# Patient Record
Sex: Female | Born: 1975 | Race: White | Hispanic: No | Smoking: Never smoker
Health system: Southern US, Community
[De-identification: ages and names within clinical notes are randomized; demographics above are authoritative.]

## PROBLEM LIST (undated history)

## (undated) DIAGNOSIS — K219 Gastro-esophageal reflux disease without esophagitis: Secondary | ICD-10-CM

## (undated) DIAGNOSIS — Z8489 Family history of other specified conditions: Secondary | ICD-10-CM

## (undated) DIAGNOSIS — G459 Transient cerebral ischemic attack, unspecified: Secondary | ICD-10-CM

## (undated) DIAGNOSIS — R51 Headache: Secondary | ICD-10-CM

## (undated) DIAGNOSIS — R519 Headache, unspecified: Secondary | ICD-10-CM

## (undated) DIAGNOSIS — D649 Anemia, unspecified: Secondary | ICD-10-CM

## (undated) HISTORY — PX: ROTATOR CUFF REPAIR: SHX139

## (undated) HISTORY — PX: COLONOSCOPY WITH ESOPHAGOGASTRODUODENOSCOPY (EGD): SHX5779

## (undated) HISTORY — DX: Transient cerebral ischemic attack, unspecified: G45.9

---

## 1999-02-08 ENCOUNTER — Encounter: Payer: Self-pay | Admitting: Neurosurgery

## 1999-02-08 ENCOUNTER — Ambulatory Visit (HOSPITAL_COMMUNITY): Admission: RE | Admit: 1999-02-08 | Discharge: 1999-02-08 | Payer: Self-pay | Admitting: Neurosurgery

## 1999-12-14 ENCOUNTER — Encounter: Admission: RE | Admit: 1999-12-14 | Discharge: 2000-03-13 | Payer: Self-pay | Admitting: Dentistry

## 2005-12-11 ENCOUNTER — Ambulatory Visit: Payer: Self-pay | Admitting: Family Medicine

## 2008-02-17 ENCOUNTER — Ambulatory Visit: Payer: Self-pay | Admitting: Family Medicine

## 2008-03-14 ENCOUNTER — Ambulatory Visit: Payer: Self-pay | Admitting: Obstetrics and Gynecology

## 2008-03-18 ENCOUNTER — Ambulatory Visit: Payer: Self-pay | Admitting: Obstetrics and Gynecology

## 2009-06-14 ENCOUNTER — Encounter
Admission: RE | Admit: 2009-06-14 | Discharge: 2009-06-14 | Payer: Self-pay | Source: Home / Self Care | Admitting: Obstetrics and Gynecology

## 2009-07-25 ENCOUNTER — Inpatient Hospital Stay (HOSPITAL_COMMUNITY): Admission: AD | Admit: 2009-07-25 | Discharge: 2009-07-26 | Payer: Self-pay | Admitting: Obstetrics & Gynecology

## 2010-06-26 LAB — CBC
HCT: 30.5 % — ABNORMAL LOW (ref 36.0–46.0)
Hemoglobin: 10.1 g/dL — ABNORMAL LOW (ref 12.0–15.0)
Hemoglobin: 9.1 g/dL — ABNORMAL LOW (ref 12.0–15.0)
MCHC: 33 g/dL (ref 30.0–36.0)
MCHC: 33.9 g/dL (ref 30.0–36.0)
MCV: 76.6 fL — ABNORMAL LOW (ref 78.0–100.0)
Platelets: 295 10*3/uL (ref 150–400)
RBC: 3.55 MIL/uL — ABNORMAL LOW (ref 3.87–5.11)
RBC: 3.98 MIL/uL (ref 3.87–5.11)
RDW: 15.1 % (ref 11.5–15.5)
RDW: 15.2 % (ref 11.5–15.5)
WBC: 11.4 10*3/uL — ABNORMAL HIGH (ref 4.0–10.5)

## 2010-06-26 LAB — RH IMMUNE GLOB WKUP(>/=20WKS)(NOT WOMEN'S HOSP): Fetal Screen: NEGATIVE

## 2010-06-26 LAB — GLUCOSE, CAPILLARY: Glucose-Capillary: 74 mg/dL (ref 70–99)

## 2010-06-26 LAB — RPR: RPR Ser Ql: NONREACTIVE

## 2010-06-26 LAB — GLUCOSE, RANDOM: Glucose, Bld: 87 mg/dL (ref 70–99)

## 2010-08-24 NOTE — Procedures (Signed)
[facility]  Patient:    Stacey Underwood, Stacey Underwood                      MRN: [id number] Proc. Date: 12/17/99 Adm. Date:  [id number] Attending:  Thyra Breed                           Procedure Report  PROCEDURE:  Lumbar facet joint nerve blocks at L3-4, 4-5 and 5-S1.  DIAGNOSIS:  Back pain with history of work related injury.  HISTORY OF PRESENT ILLNESS:  Stacey Underwood is a 35 year old who is sent to [place] by Dr. Murray Hodgkins for a trial of lumbar sympathetic blocks on the right side.  The patient gives a history of a work related injury which occurred in January of 2000. Initially she was treated at the [facility] in [place] and eventually seen by Dr. Aliene Beams who obtained an MRI and x-rays of her back which demonstrated some mild facet joint arthropathy at L5-S1. She was sent to Dr. Murray Hodgkins after she failed physical therapy and underwent more intense physical therapy followed by a trial of a TENS unit which does result in partial improvement and chiropractic treatments which would temporarily help. She stated that physical therapy increased her discomfort. She was tried on steroid dosepak and nonsteroidals with no overall improvement.  She describes her pain as a sharp pain on the right side of her lower back at the lumbosacral region which is associated with global numbness and tingling of her right lower extremity. She had some give away weakness of her right lower extremity. She denied any bowel or bladder incontinence. She states that her pain is made worse by prolonged stationary position such as sitting or standing and improved by the TENS unit.  CURRENT MEDICATIONS:  Ibuprofen, effexor and birth control pills.  ALLERGIES:  No known drug allergies.  FAMILY HISTORY:  Positive for hypertension, diabetes and coronary artery disease.  PAST SURGICAL HISTORY:  Negative.  SOCIAL HISTORY:  The patient works as an Engineer, materials dyes. She does  not smoke nor drink alcohol.  ACTIVE MEDICAL PROBLEMS:  Depression, anxiety and probable underlying allergic rhinitis.  REVIEW OF SYSTEMS:  GENERAL:  Significant for some recent nasal stuffiness and associated malaise. HEAD:  She has retro-orbital headaches. EYES:  Negative. NOSE/MOUTH/THROAT:  Significant for runny nose recently. PULMONARY:  Negative. CARDIOVASCULAR:  Negative. GI: Negative. GU:  Negative.  MUSCULOSKELETAL:  See HPI for pertinent positives. NEUROLOGIC:  See HPI for pertinent positives and a history of seizure or stroke-like symptoms. HEMATOLOGIC:  Negative. CUTANEOUS:  Negative.  ENDOCRINE:  Negative. ALLERGY/IMMUNOLOGIC:  Significant for recent nasal stuffiness. PSYCHIATRIC:  Significant for depression.  PHYSICAL EXAMINATION:  VITAL SIGNS:  Blood pressure 119/69, heart rate 72, respiratory rate 10, O2 saturations 99%, pain level 7-8/10 and temperature is 98.  GENERAL:  This is a pleasant female in no acute distress.  HEENT:  Head was normocephalic, atraumatic. Eyes, extraocular movements intact with conjunctivae and sclerae clear. Nose patent nares without discharge. Oropharynx was free of lesions. Good dentition.  NECK:  Supple without lymphadenopathy. Carotids are 2+ and symmetric without bruits.  LUNGS:  Clear.  HEART:  Regular rate and rhythm without murmur, gallop or rub.  BREASTS/ABDOMINAL/PELVIC/RECTAL:  Not performed.  EXTREMITIES:  Demonstrate no cyanosis, clubbing or edema with radial pulses and dorsalis pedis pulse 2+ and symmetric.  NEUROLOGIC:  The patient was oriented x 4. Cranial nerves II-XII are grossly intact. Deep  tendon reflexes were symmetric in the upper and lower extremities with down going toes. Motor was 5/5 with symmetric bulk and tone. Sensation was intact to vibratory sense and pinprick as well as light touch. Coordination was grossly intact.  BACK:  Revealed increased pain on hyperextension to about 30 degrees with reduced  pain on forward flexion. Most of her pain was localized to the right lower lumbosacral region. The area was marked.  IMPRESSION: 1. Low back pain which the patient relates from a work related injury in    January of 2000 with MRI demonstrating some mild bilateral L5-S1 facet    joint arthropathy. 2. Depression.  DISPOSITION:  I discussed the potential risks, benefits and limitations of a facet nerve block in detail with the patient. The patient is interested in proceeding.  DESCRIPTION OF PROCEDURE:  After informed consent was obtained, the patient was taken to the fluoroscopy suite where she was placed in the prone position with a pillow under her abdomen and monitored. Her back was examined using fluoroscopic guidance and the lumbar vertebrae L1 through S1 were marked over their spinous processes. The junction of the superior articulating process where the transverse process was marked at L3, 4-5 and the sacral cornu was identified and marked on the right side. The patient had pin pointed her pain predominantly over the region of the L4-5 facet joint on exam when the skin was marked. The skin was prepped with Betadine x 3 and draped. Using a 25 gauge needle, I anesthetized the skin and subcutaneous structures at each of the levels with 2 cc of 1% lidocaine. A 25 gauge spinal needle was introduced down to the junction of the superior articulating process and the transverse process at L3-4 and 4-5 and down to the sacral cornu at L5-S1. Aspiration was negative for blood and CSF. Oblique projections confirmed placement as well as lateral projections. The needles were injected with a 0.5 cc of 1% lidocaine at each level. Thirty seconds was allowed to pass and there was no evidence of numbness or tingling out into the lower extremity on the right side nor weakness. 1 cc of 1% lidocaine with the approximately 12 mg of Medrol was injected at each level and the needles were flushed with a 0.5  cc of 1% lidocaine and removed intact. Her back was cleansed free of Betadine.  Fifteen minutes after the procedure, the patient noted no reduction in her  back discomfort. This suggested that facet joints are not the source of her pain. Her vital signs were stable.  DISPOSITION: 1. Resume previous diet. 2. Limitations in activities per instruction sheet. 3. Continue on current medications. 4. The patient plans to follow-up with Dr. Murray Hodgkins next week. DD:  12/17/99 TD:  12/17/99 Job: [id number]

## 2013-11-19 ENCOUNTER — Emergency Department: Payer: Self-pay | Admitting: Emergency Medicine

## 2013-11-19 LAB — URINALYSIS, COMPLETE
Bilirubin,UR: NEGATIVE
Glucose,UR: NEGATIVE mg/dL (ref 0–75)
Ketone: NEGATIVE
Nitrite: NEGATIVE
Ph: 6 (ref 4.5–8.0)
Protein: 500
Specific Gravity: 1.03 (ref 1.003–1.030)

## 2014-04-08 HISTORY — PX: CARPAL TUNNEL RELEASE: SHX101

## 2014-04-22 ENCOUNTER — Ambulatory Visit: Payer: Self-pay | Admitting: Specialist

## 2014-04-22 LAB — BASIC METABOLIC PANEL
Anion Gap: 5 — ABNORMAL LOW (ref 7–16)
BUN: 12 mg/dL (ref 7–18)
CALCIUM: 8.7 mg/dL (ref 8.5–10.1)
CHLORIDE: 105 mmol/L (ref 98–107)
Co2: 28 mmol/L (ref 21–32)
Creatinine: 0.7 mg/dL (ref 0.60–1.30)
EGFR (African American): >60
EGFR (Non-African Amer.): >60
Glucose: 106 mg/dL — ABNORMAL HIGH (ref 65–99)
OSMOLALITY: 276 (ref 275–301)
POTASSIUM: 3.8 mmol/L (ref 3.5–5.1)
SODIUM: 138 mmol/L (ref 136–145)

## 2014-06-15 ENCOUNTER — Ambulatory Visit: Payer: Self-pay | Admitting: Otolaryngology

## 2014-07-26 ENCOUNTER — Encounter
Admit: 2014-07-26 | Disposition: A | Discharge status: Home / Self Care | Payer: Self-pay | Attending: Otolaryngology | Admitting: Otolaryngology

## 2014-08-07 NOTE — Op Note (Signed)
PATIENT NAME:  Stacey Underwood, Stacey Underwood MR#:  [id number] DATE OF BIRTH:  1975-06-28  DATE OF PROCEDURE:  04/22/2014  PREOPERATIVE DIAGNOSIS: Bilateral carpal tunnel syndrome.   POSTOPERATIVE DIAGNOSIS: Bilateral carpal tunnel syndrome.   PROCEDURES: 1. Right carpal tunnel release.  2. Left carpal tunnel release.   SURGEON: Lucas Mallow, MD    ANESTHESIA: General.   COMPLICATIONS: None.   TOURNIQUET TIME: 18 minutes on the right and 14 minutes on the left.   DESCRIPTION OF PROCEDURE: After adequate induction of general anesthesia, both upper extremities were thoroughly prepped with alcohol and ChloraPrep and draped in standard sterile fashion. Identical procedures were performed on each side. The extremity is wrapped out with the Esmarch bandage and pneumatic tourniquet elevated to 250 mmHg. Under loupe magnification, standard volar carpal tunnel incision is made and the dissection carefully carried down to the transverse retinacular ligament. This is carefully incised in the midportion. The distal release is performed with the small scissors. The proximal release is performed under direct vision using the small scissors and the carpal tunnel scissors. Careful check is made both proximally and distally to ensure that complete release has been obtained. The wound is thoroughly irrigated multiple times. Skin edges are infiltrated with 0.5% plain Marcaine. The skin is closed with 4-0 nylon. A soft bulky dressing is applied. The tourniquet is released, and the patient is returned to the recovery room in satisfactory condition, having tolerated the procedure quite well.    ____________________________ Lucas Mallow, MD ces:mw D: 04/22/2014 11:02:55 ET T: 04/22/2014 12:37:21 ET JOB#: [id number]  cc: Lucas Mallow, MD, <Dictator> Lucas Mallow MD ELECTRONICALLY SIGNED 04/27/2014 15:54

## 2014-08-08 ENCOUNTER — Ambulatory Visit: Payer: No Typology Code available for payment source | Admitting: Speech Pathology

## 2014-08-08 ENCOUNTER — Ambulatory Visit: Payer: Self-pay | Admitting: Speech Pathology

## 2014-08-17 ENCOUNTER — Ambulatory Visit: Payer: No Typology Code available for payment source | Admitting: Speech Pathology

## 2014-08-19 ENCOUNTER — Ambulatory Visit: Payer: No Typology Code available for payment source | Admitting: Speech Pathology

## 2014-08-22 ENCOUNTER — Ambulatory Visit: Payer: Self-pay | Admitting: Speech Pathology

## 2014-08-25 ENCOUNTER — Ambulatory Visit: Payer: Self-pay | Admitting: Speech Pathology

## 2014-09-22 ENCOUNTER — Ambulatory Visit
Admission: RE | Admit: 2014-09-22 | Discharge: 2014-09-22 | Disposition: A | Discharge status: Home / Self Care | Payer: No Typology Code available for payment source | Source: Ambulatory Visit | Attending: Internal Medicine | Admitting: Internal Medicine

## 2014-09-22 ENCOUNTER — Ambulatory Visit
Admission: RE | Admit: 2014-09-22 | Discharge: 2014-09-22 | Disposition: A | Discharge status: Home / Self Care | Payer: No Typology Code available for payment source | Source: Ambulatory Visit | Attending: Cardiology | Admitting: Cardiology

## 2014-09-22 ENCOUNTER — Other Ambulatory Visit: Payer: Self-pay | Admitting: Internal Medicine

## 2014-09-22 DIAGNOSIS — R490 Dysphonia: Secondary | ICD-10-CM

## 2014-09-29 ENCOUNTER — Ambulatory Visit: Payer: Self-pay | Admitting: General Surgery

## 2014-10-05 ENCOUNTER — Encounter: Payer: Self-pay | Admitting: *Deleted

## 2014-10-20 ENCOUNTER — Other Ambulatory Visit: Payer: Self-pay | Admitting: Family Medicine

## 2014-10-20 ENCOUNTER — Ambulatory Visit (INDEPENDENT_AMBULATORY_CARE_PROVIDER_SITE_OTHER): Payer: No Typology Code available for payment source | Admitting: Family Medicine

## 2014-10-20 ENCOUNTER — Encounter: Payer: Self-pay | Admitting: Family Medicine

## 2014-10-20 VITALS — BP 108/70 | HR 60 | Temp 98.7°F | Resp 16 | Ht 60.0 in | Wt 213.0 lb

## 2014-10-20 DIAGNOSIS — E559 Vitamin D deficiency, unspecified: Secondary | ICD-10-CM | POA: Insufficient documentation

## 2014-10-20 DIAGNOSIS — F329 Major depressive disorder, single episode, unspecified: Secondary | ICD-10-CM | POA: Insufficient documentation

## 2014-10-20 DIAGNOSIS — L709 Acne, unspecified: Secondary | ICD-10-CM | POA: Insufficient documentation

## 2014-10-20 DIAGNOSIS — L989 Disorder of the skin and subcutaneous tissue, unspecified: Secondary | ICD-10-CM | POA: Insufficient documentation

## 2014-10-20 DIAGNOSIS — N888 Other specified noninflammatory disorders of cervix uteri: Secondary | ICD-10-CM | POA: Insufficient documentation

## 2014-10-20 DIAGNOSIS — D259 Leiomyoma of uterus, unspecified: Secondary | ICD-10-CM | POA: Insufficient documentation

## 2014-10-20 DIAGNOSIS — R7303 Prediabetes: Secondary | ICD-10-CM | POA: Insufficient documentation

## 2014-10-20 DIAGNOSIS — N898 Other specified noninflammatory disorders of vagina: Secondary | ICD-10-CM | POA: Insufficient documentation

## 2014-10-20 DIAGNOSIS — D649 Anemia, unspecified: Secondary | ICD-10-CM | POA: Insufficient documentation

## 2014-10-20 DIAGNOSIS — R21 Rash and other nonspecific skin eruption: Secondary | ICD-10-CM | POA: Insufficient documentation

## 2014-10-20 DIAGNOSIS — J309 Allergic rhinitis, unspecified: Secondary | ICD-10-CM | POA: Insufficient documentation

## 2014-10-20 DIAGNOSIS — F319 Bipolar disorder, unspecified: Secondary | ICD-10-CM | POA: Insufficient documentation

## 2014-10-20 DIAGNOSIS — T7840XA Allergy, unspecified, initial encounter: Secondary | ICD-10-CM | POA: Insufficient documentation

## 2014-10-20 DIAGNOSIS — F419 Anxiety disorder, unspecified: Secondary | ICD-10-CM | POA: Insufficient documentation

## 2014-10-20 DIAGNOSIS — N9489 Other specified conditions associated with female genital organs and menstrual cycle: Secondary | ICD-10-CM

## 2014-10-20 DIAGNOSIS — F32A Depression, unspecified: Secondary | ICD-10-CM | POA: Insufficient documentation

## 2014-10-20 DIAGNOSIS — E7800 Pure hypercholesterolemia, unspecified: Secondary | ICD-10-CM | POA: Insufficient documentation

## 2014-10-20 MED ORDER — TRIAMCINOLONE ACETONIDE 0.1 % EX CREA: 1.0000 "application " | TOPICAL_CREAM | Freq: Two times a day (BID) | CUTANEOUS | Status: DC

## 2014-10-20 NOTE — Progress Notes (Signed)
Subjective:    Patient ID: Stacey Underwood, female    DOB: April 02, 1976, 39 y.o.   MRN: [id number]  Rash This is a new problem. The current episode started in the past 7 days. The problem has been gradually worsening since onset. The affected locations include the left arm, right arm, face, left fingers and right fingers. The rash is characterized by itchiness and redness. She was exposed to nothing. Associated symptoms include a sore throat ("itchy"). Pertinent negatives include no anorexia, congestion, cough, diarrhea, eye pain, facial edema, fatigue, fever, joint pain, nail changes, rhinorrhea, shortness of breath or vomiting. Past treatments include topical steroids. The treatment provided no relief.   Abnormal Vaginal Odor Pt believes she has bacterial vaginitis. Reports she had this in the past, and the sx are similar. Pt does have a new monogamous sexual partner, her boyfriend. They do not use condoms. Pt reports they had sex in a hot tub, and believes this is why she has BV.    Review of Systems  Constitutional: Negative for fever and fatigue.  HENT: Positive for sore throat ("itchy"). Negative for congestion and rhinorrhea.   Eyes: Negative for pain.  Respiratory: Negative for cough and shortness of breath.   Gastrointestinal: Negative for vomiting, diarrhea and anorexia.  Musculoskeletal: Negative for joint pain.  Skin: Positive for rash. Negative for nail changes.   BP 108/70 mmHg  Pulse 60  Temp(Src) 98.7 F (37.1 C) (Oral)  Resp 16  Ht 5' (1.524 m)  Wt 213 lb (96.616 kg)  BMI 41.60 kg/m2  LMP  (Approximate)   Patient Active Problem List   Diagnosis Date Noted  . Acne 10/20/2014  . Allergic reaction 10/20/2014  . Allergic rhinitis 10/20/2014  . Absolute anemia 10/20/2014  . Anxiety 10/20/2014  . 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 Cervical cyst 10/20/2014  . Clinical depression 10/20/2014  . Hypercholesteremia 10/20/2014  . 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  Leiomyoma of uterus 10/20/2014  . Avitaminosis D 10/20/2014   No past medical history on file. No current outpatient prescriptions on file prior to visit.   No current facility-administered medications on file prior to visit.   No Known Allergies Past Surgical History  Procedure Laterality Date  . Carpal tunnel release Bilateral 04/2014   History   Social History  . Marital Status: Divorced    Spouse Name: N/A  . Number of Children: N/A  . Years of Education: N/A   Occupational History  . Not on file.   Social History Main Topics  . Smoking status: Never Smoker   . Smokeless tobacco: Never Used  . Alcohol Use: No  . Drug Use: No  . Sexual Activity: Not on file   Other Topics Concern  . Not on file   Social History Narrative   Family History  Problem Relation Age of Onset  . Healthy Mother   . Healthy Father   . Healthy Brother       Objective:   Physical Exam  Constitutional: She is oriented to person, place, and time. She appears well-developed and well-nourished.  Cardiovascular: Normal rate and regular rhythm.   Pulmonary/Chest: Effort normal and breath sounds normal.  Genitourinary: There is no rash or lesion on the right labia. There is no rash or lesion on the left labia. No erythema in the vagina. No signs of injury around the vagina. No vaginal discharge found.  Neurological: She is alert and oriented to person, place, and time.  Skin: Rash (Multiple erythermatous  papules on arms. ) noted.   BP 108/70 mmHg  Pulse 60  Temp(Src) 98.7 F (37.1 C) (Oral)  Resp 16  Ht 5' (1.524 m)  Wt 213 lb (96.616 kg)  BMI 41.60 kg/m2  LMP  (Approximate)    Assessment & Plan:  1. Vaginal discharge Will send for studies below.  - GC/Chlamydia Probe Amp - WET PREP FOR [place], YEAST, CLUE  2. Skin rash Will treat with steroid cream Please call back if condition worsens or does not continue to improve.    - triamcinolone cream (KENALOG) 0.1 %; Apply 1 application  topically 2 (two) times daily.  Dispense: 45 g; Refill: 0  3. Vaginal odor Studies as below. Will address further as needed.  - GC/Chlamydia Probe Amp - WET PREP FOR TRICH, YEAST, CLUE  Margarita Rana, MD

## 2014-10-24 LAB — GC/CHLAMYDIA PROBE AMP
CHLAMYDIA, DNA PROBE: NEGATIVE
NEISSERIA GONORRHOEAE BY PCR: NEGATIVE

## 2014-10-24 LAB — WET PREP FOR TRICH, YEAST, CLUE
Clue Cell Exam: POSITIVE — AB
TRICHOMONAS EXAM: NEGATIVE
Yeast Exam: NEGATIVE

## 2014-10-24 LAB — PLEASE NOTE

## 2014-10-25 ENCOUNTER — Telehealth: Payer: Self-pay

## 2014-10-25 DIAGNOSIS — N760 Acute vaginitis: Principal | ICD-10-CM

## 2014-10-25 DIAGNOSIS — B9689 Other specified bacterial agents as the cause of diseases classified elsewhere: Secondary | ICD-10-CM | POA: Insufficient documentation

## 2014-10-25 MED ORDER — METRONIDAZOLE 500 MG PO TABS: 500.0000 mg | ORAL_TABLET | Freq: Two times a day (BID) | ORAL | Status: DC

## 2014-10-25 NOTE — Telephone Encounter (Signed)
-----   Message from Margarita Rana, MD sent at 10/24/2014  8:34 PM EDT ----- Labs ok except for does have BV. If still with symptoms, would recommend Flagyl 500 mg bid for 7 days. Thanks.

## 2014-10-25 NOTE — Telephone Encounter (Signed)
Pt advised as directed below.  She was still having symptoms, so I sent Flagyl to [facility].  Thanks,   -Mickel Baas

## 2014-11-10 ENCOUNTER — Other Ambulatory Visit: Payer: Self-pay

## 2014-11-10 DIAGNOSIS — F32A Depression, unspecified: Secondary | ICD-10-CM

## 2014-11-10 DIAGNOSIS — F329 Major depressive disorder, single episode, unspecified: Secondary | ICD-10-CM

## 2014-11-10 MED ORDER — SERTRALINE HCL 100 MG PO TABS: 100.0000 mg | ORAL_TABLET | Freq: Every day | ORAL | Status: DC

## 2014-11-28 ENCOUNTER — Encounter: Payer: Self-pay | Admitting: Family Medicine

## 2014-11-28 ENCOUNTER — Ambulatory Visit (INDEPENDENT_AMBULATORY_CARE_PROVIDER_SITE_OTHER): Payer: No Typology Code available for payment source | Admitting: Family Medicine

## 2014-11-28 VITALS — BP 116/72 | HR 88 | Temp 99.0°F | Resp 16 | Ht 61.0 in | Wt 210.8 lb

## 2014-11-28 DIAGNOSIS — J029 Acute pharyngitis, unspecified: Secondary | ICD-10-CM | POA: Diagnosis not present

## 2014-11-28 LAB — POCT RAPID STREP A (OFFICE): Rapid Strep A Screen: NEGATIVE

## 2014-11-28 NOTE — Progress Notes (Signed)
Subjective:     Patient ID: Stacey Underwood, female   DOB: 27-May-1975, 39 y.o.   MRN: [id number]  HPI  Chief Complaint  Patient presents with  . Sore Throat    Patient comes in office today with concerns of sore throat and hoarsness over the past 24 hrs. Patient states that she has pain in her left ear and swelling oif lymph node on left side. Associates symptoms include: nausea, low grade fever, headache and body ache. Patient denies being exposed to strep  States her 2 year old daughter is not ill.   Review of Systems  HENT:       Chronic hoarseness prior to current illness. Has had ENT evaluation-attributed to  GERD.       Objective:   Physical Exam  Constitutional: She appears well-developed and well-nourished. No distress.  Ears: T.M's intact without inflammation Throat: no tonsillar enlargement but exudate noted on left tonsil. Neck: right anterior and posterior cervical nodes appreciated. Lungs: clear     Assessment:    1. Pharyngitis - POCT rapid strep A    Plan:    Early URI, consider EBV testing if not improving. Work excuse for 8/22-26.

## 2014-11-28 NOTE — Patient Instructions (Signed)
Discussed use of Mucinex D for congestion, Delsym for cough, and Benadryl for postnasal drainage 

## 2014-12-08 ENCOUNTER — Encounter: Payer: Self-pay | Admitting: Family Medicine

## 2014-12-08 ENCOUNTER — Ambulatory Visit (INDEPENDENT_AMBULATORY_CARE_PROVIDER_SITE_OTHER): Payer: No Typology Code available for payment source | Admitting: Family Medicine

## 2014-12-08 VITALS — BP 100/62 | HR 80 | Temp 98.1°F | Resp 16 | Wt 211.0 lb

## 2014-12-08 DIAGNOSIS — F329 Major depressive disorder, single episode, unspecified: Secondary | ICD-10-CM | POA: Diagnosis not present

## 2014-12-08 DIAGNOSIS — B3731 Acute candidiasis of vulva and vagina: Secondary | ICD-10-CM | POA: Insufficient documentation

## 2014-12-08 DIAGNOSIS — B373 Candidiasis of vulva and vagina: Secondary | ICD-10-CM

## 2014-12-08 DIAGNOSIS — F32A Depression, unspecified: Secondary | ICD-10-CM

## 2014-12-08 LAB — POCT WET PREP WITH KOH
CLUE CELLS WET PREP PER HPF POC: NEGATIVE
RBC Wet Prep HPF POC: NEGATIVE
TRICHOMONAS UA: NEGATIVE
WBC Wet Prep HPF POC: NEGATIVE
YEAST WET PREP PER HPF POC: POSITIVE

## 2014-12-08 MED ORDER — TERCONAZOLE 0.8 % VA CREA: 1.0000 | TOPICAL_CREAM | Freq: Every day | VAGINAL | Status: DC

## 2014-12-08 MED ORDER — SERTRALINE HCL 100 MG PO TABS: 100.0000 mg | ORAL_TABLET | Freq: Every day | ORAL | Status: DC

## 2014-12-08 MED ORDER — FLUCONAZOLE 150 MG PO TABS
150.0000 mg | ORAL_TABLET | Freq: Once | ORAL | Status: DC
Start: 2014-12-08 — End: 2015-01-25

## 2014-12-08 NOTE — Progress Notes (Signed)
Subjective:    Patient ID: Stacey Underwood, female    DOB: Nov 27, 1975, 39 y.o.   MRN: [id number]  Vaginal Discharge The patient's primary symptoms include genital itching, a genital odor and vaginal discharge. The patient's pertinent negatives include no genital lesions, genital rash, missed menses or vaginal bleeding. Episode onset: 2 days ago. The problem has been unchanged. The pain is mild (with urination). Associated symptoms include a sore throat. Pertinent negatives include no abdominal pain, anorexia, back pain, chills, constipation, diarrhea, discolored urine, dysuria, fever, flank pain, frequency, headaches, hematuria, joint pain, joint swelling, nausea, painful intercourse, rash, urgency or vomiting. Vaginal discharge characteristics: "creamy colored" She has tried antifungals for the symptoms. The treatment provided no relief. She is not sexually active.  Sore Throat  The problem has been gradually improving (Pt saw Mikki Santee and is requesting provider look at throat to check on this). The pain is worse on the left side. There has been no fever. Pertinent negatives include no abdominal pain, diarrhea, headaches or vomiting.  Depression Pt requesting refill on Sertraline 100 mg. Reports no compliance, and reports good sx control.    Review of Systems  Constitutional: Negative for fever and chills.  HENT: Positive for sore throat and voice change.   Gastrointestinal: Negative for nausea, vomiting, abdominal pain, diarrhea, constipation and anorexia.  Genitourinary: Positive for vaginal discharge. Negative for dysuria, urgency, frequency, hematuria, flank pain and missed menses.  Musculoskeletal: Negative for back pain and joint pain.  Skin: Negative for rash.  Neurological: Negative for headaches.   BP 100/62 mmHg  Pulse 80  Temp(Src) 98.1 F (36.7 C) (Oral)  Resp 16  Wt 211 lb (95.709 kg)  LMP 11/19/2014   Patient Active Problem List   Diagnosis Date Noted  . Bacterial  vaginosis 10/25/2014  . Acne 10/20/2014  . Allergic reaction 10/20/2014  . Allergic rhinitis 10/20/2014  . Absolute anemia 10/20/2014  . Anxiety 10/20/2014  . 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 Cervical cyst 10/20/2014  . Clinical depression 10/20/2014  . Hypercholesteremia 10/20/2014  . 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 Leiomyoma of uterus 10/20/2014  . Avitaminosis D 10/20/2014  . Vaginal discharge 10/20/2014  . Skin rash 10/20/2014   No past medical history on file. Current Outpatient Prescriptions on File Prior to Visit  Medication Sig  . FLUTICASONE PROPIONATE, NASAL, NA Place into the nose.  . levonorgestrel (MIRENA, 52 MG,) 20 MCG/24HR IUD by Intrauterine route.  . montelukast (SINGULAIR) 10 MG tablet Take by mouth.  Marland Kitchen omeprazole (PRILOSEC) 40 MG capsule TAKE ONE CAPSULE BY MOUTH ONCE DAILY 30 MINUTES BEFORE MORNING MEAL  . ranitidine (ZANTAC) 150 MG tablet Take 150 mg by mouth 2 (two) times daily.  . sertraline (ZOLOFT) 100 MG tablet Take 1 tablet (100 mg total) by mouth daily.  Marland Kitchen triamcinolone cream (KENALOG) 0.1 % Apply 1 application topically 2 (two) times daily.   No current facility-administered medications on file prior to visit.   No Known Allergies Past Surgical History  Procedure Laterality Date  . Carpal tunnel release Bilateral 04/2014   Social History   Social History  . Marital Status: Divorced    Spouse Name: N/A  . Number of Children: N/A  . Years of Education: N/A   Occupational History  . Not on file.   Social History Main Topics  . Smoking status: Never Smoker   . Smokeless tobacco: Never Used  . Alcohol Use: No  . Drug Use: No  . Sexual Activity: Not on file  Other Topics Concern  . Not on file   Social History Narrative   Family History  Problem Relation Age of Onset  . Healthy Mother   . Healthy Father   . Healthy Brother       Objective:   Physical Exam  Constitutional: She is oriented to person, place, and time.  She appears well-developed and well-nourished.  Genitourinary: Vaginal discharge (cottage cheesy discharge noted. ) found.  Neurological: She is alert and oriented to person, place, and time.   BP 100/62 mmHg  Pulse 80  Temp(Src) 98.1 F (36.7 C) (Oral)  Resp 16  Wt 211 lb (95.709 kg)  LMP 11/19/2014      Assessment & Plan:  1. Clinical depression Condition is stable. Please continue current medication and  plan of care as noted.  Will send in prescription.  - sertraline (ZOLOFT) 100 MG tablet; Take 1 tablet (100 mg total) by mouth daily.  Dispense: 90 tablet; Refill: 3  2. Vaginal candidiasis Worsening.  KOH  Positive.   - fluconazole (DIFLUCAN) 150 MG tablet; Take 1 tablet (150 mg total) by mouth once.  Dispense: 1 tablet; Refill: 0 - terconazole (TERAZOL 3) 0.8 % vaginal cream; Place 1 applicator vaginally at bedtime.  Dispense: 20 g; Refill: 0 - POCT Wet Prep with KOH  Margarita Rana, MD

## 2015-01-25 ENCOUNTER — Ambulatory Visit (INDEPENDENT_AMBULATORY_CARE_PROVIDER_SITE_OTHER): Payer: No Typology Code available for payment source | Admitting: Family Medicine

## 2015-01-25 ENCOUNTER — Encounter: Payer: Self-pay | Admitting: Family Medicine

## 2015-01-25 VITALS — BP 110/75 | HR 84 | Temp 98.5°F | Resp 20 | Wt 207.0 lb

## 2015-01-25 DIAGNOSIS — G4700 Insomnia, unspecified: Secondary | ICD-10-CM | POA: Diagnosis not present

## 2015-01-25 DIAGNOSIS — Z23 Encounter for immunization: Secondary | ICD-10-CM | POA: Diagnosis not present

## 2015-01-25 DIAGNOSIS — E559 Vitamin D deficiency, unspecified: Secondary | ICD-10-CM | POA: Diagnosis not present

## 2015-01-25 DIAGNOSIS — F32A Depression, unspecified: Secondary | ICD-10-CM

## 2015-01-25 DIAGNOSIS — F419 Anxiety disorder, unspecified: Secondary | ICD-10-CM | POA: Diagnosis not present

## 2015-01-25 DIAGNOSIS — F329 Major depressive disorder, single episode, unspecified: Secondary | ICD-10-CM

## 2015-01-25 MED ORDER — TRAZODONE HCL 50 MG PO TABS: 25.0000 mg | ORAL_TABLET | Freq: Every evening | ORAL | Status: DC | PRN

## 2015-01-25 MED ORDER — SERTRALINE HCL 100 MG PO TABS: 150.0000 mg | ORAL_TABLET | Freq: Every day | ORAL | Status: DC

## 2015-01-25 NOTE — Progress Notes (Signed)
Subjective:    Patient ID: Stacey Underwood, female    DOB: [birth year], 39 y.o.   MRN: [id number]  Depression      The patient presents with depression.  This is a chronic problem.  The current episode started 1 to 4 weeks ago.   The onset quality is sudden.   The problem occurs daily.  The problem has been rapidly worsening since onset.  Associated symptoms include decreased concentration (pt reports she is afraid she will lose her job, and she is letting herself down), fatigue, helplessness, hopelessness, insomnia, irritable, restlessness, decreased interest, appetite change, body aches, myalgias, headaches, indigestion and sad.  Associated symptoms include no suicidal ideas.( nasuea)     The symptoms are aggravated by family issues (Ex husband recently died in a motorcyle accident ).  Past treatments include SSRIs - Selective serotonin reuptake inhibitors (Zoloft 100 mg).  Compliance with treatment is good.  Risk factors include major life event.   Past medical history includes bipolar disorder, depression and mental health disorder.    Is having trouble sleeping. Crying spells. Trying to be there for her daughter and his other kids.   Has been on higher dose of Zoloft in the past.     Review of Systems  Constitutional: Positive for appetite change and fatigue.  Gastrointestinal: Positive for nausea.  Musculoskeletal: Positive for myalgias.  Neurological: Positive for headaches.  Psychiatric/Behavioral: Positive for depression and decreased concentration (pt reports she is afraid she will lose her job, and she is letting herself down). Negative for suicidal ideas. The patient has insomnia.    BP 110/75 mmHg  Pulse 84  Temp(Src) 98.5 F (36.9 C) (Oral)  Resp 20  Wt 207 lb (93.895 kg)   Patient Active Problem List   Diagnosis Date Noted  . Vaginal candidiasis 12/08/2014  . Bacterial vaginosis 10/25/2014  . Acne 10/20/2014  . Allergic reaction 10/20/2014  . Allergic rhinitis  10/20/2014  . Absolute anemia 10/20/2014  . Anxiety 10/20/2014  . Affective bipolar disorder ([place]) 10/20/2014  . Cervical cyst 10/20/2014  . Clinical depression 10/20/2014  . Hypercholesteremia 10/20/2014  . 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 Leiomyoma of uterus 10/20/2014  . Avitaminosis D 10/20/2014  . Vaginal discharge 10/20/2014  . Skin rash 10/20/2014  . 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 Current Outpatient Prescriptions on File Prior to Visit  Medication Sig  . FLUTICASONE PROPIONATE, NASAL, NA Place into the nose.  . levonorgestrel (MIRENA, 52 MG,) 20 MCG/24HR IUD by Intrauterine route.  Marland Kitchen omeprazole (PRILOSEC) 40 MG capsule TAKE ONE CAPSULE BY MOUTH ONCE DAILY 30 MINUTES BEFORE MORNING MEAL  . ranitidine (ZANTAC) 150 MG tablet Take 150 mg by mouth 2 (two) times daily.  . sertraline (ZOLOFT) 100 MG tablet Take 1 tablet (100 mg total) by mouth daily.  . montelukast (SINGULAIR) 10 MG tablet Take by mouth.  . triamcinolone cream (KENALOG) 0.1 % Apply 1 application topically 2 (two) times daily. (Patient not taking: Reported on 01/25/2015)   No current facility-administered medications on file prior to visit.   No Known Allergies Past Surgical History  Procedure Laterality Date  . Carpal tunnel release Bilateral 04/2014   Social History   Social History  . Marital Status: Divorced    Spouse Name: N/A  . Number of Children: N/A  . Years of Education: N/A   Occupational History  . Not on file.   Social History Main Topics  .  Smoking status: Never Smoker   . Smokeless tobacco: Never Used  . Alcohol Use: No  . Drug Use: No  . Sexual Activity: Not on file   Other Topics Concern  . Not on file   Social History Narrative   Family History  Problem Relation Age of Onset  . Healthy Mother   . Healthy Father   . Healthy Brother       Objective:   Physical Exam  Constitutional: She is oriented to  person, place, and time. She appears well-developed and well-nourished. She is irritable.  Cardiovascular: Normal rate and regular rhythm.   Pulmonary/Chest: Effort normal and breath sounds normal.  Neurological: She is alert and oriented to person, place, and time.  Psychiatric: She has a normal mood and affect. Her behavior is normal. Judgment and thought content normal.  BP 110/75 mmHg  Pulse 84  Temp(Src) 98.5 F (36.9 C) (Oral)  Resp 20  Wt 207 lb (93.895 kg)      Assessment & Plan:  1. Avitaminosis D Stable.   2. Insomnia New problem. Worsening. Will start medication.  Call if worsens or does not improve. - traZODone (DESYREL) 50 MG tablet; Take 0.5-1 tablets (25-50 mg total) by mouth at bedtime as needed for sleep.  Dispense: 30 tablet; Refill: 3  3. Anxiety Worsening, since death of her ex-husband    4. Clinical depression Worsening since death of her ex-husband. Will increase Zoloft to  150 mg a day.   Call if worsens or does not improve.   - sertraline (ZOLOFT) 100 MG tablet; Take 1.5 tablets (150 mg total) by mouth daily.  Dispense: 135 tablet; Refill: 3  5. Encounter for immunization Given today.   Margarita Rana, MD

## 2015-02-22 ENCOUNTER — Ambulatory Visit: Payer: No Typology Code available for payment source | Admitting: Family Medicine

## 2015-02-24 ENCOUNTER — Ambulatory Visit (INDEPENDENT_AMBULATORY_CARE_PROVIDER_SITE_OTHER): Payer: No Typology Code available for payment source | Admitting: Family Medicine

## 2015-02-24 ENCOUNTER — Encounter: Payer: Self-pay | Admitting: Family Medicine

## 2015-02-24 VITALS — BP 112/64 | HR 80 | Temp 98.7°F | Resp 16 | Wt 207.0 lb

## 2015-02-24 DIAGNOSIS — R21 Rash and other nonspecific skin eruption: Secondary | ICD-10-CM

## 2015-02-24 DIAGNOSIS — L509 Urticaria, unspecified: Secondary | ICD-10-CM | POA: Insufficient documentation

## 2015-02-24 DIAGNOSIS — G4700 Insomnia, unspecified: Secondary | ICD-10-CM | POA: Diagnosis not present

## 2015-02-24 DIAGNOSIS — Z202 Contact with and (suspected) exposure to infections with a predominantly sexual mode of transmission: Secondary | ICD-10-CM | POA: Diagnosis not present

## 2015-02-24 MED ORDER — DOXEPIN HCL 25 MG PO CAPS: 25.0000 mg | ORAL_CAPSULE | Freq: Every day | ORAL | Status: DC

## 2015-02-24 NOTE — Progress Notes (Signed)
Patient ID: Stacey Underwood, female   DOB: 1975-05-03, 39 y.o.   MRN: [id number]         Patient: Stacey Underwood Female    DOB: May 29, 1975   39 y.o.   MRN: [id number] Visit Date: 02/24/2015  Today's Provider: Margarita Rana, MD   Chief Complaint  Patient presents with  . Exposure to STD   Subjective:    Exposure to STD  The patient's primary symptoms include a discharge.  Insomnia Primary symptoms: sleep disturbance, difficulty falling asleep, frequent awakening, no malaise/fatigue.  The current episode started more than one month. The problem has been gradually improving (Since started Trazodone; but pt reports it is not back to baseline.) since onset. The symptoms are aggravated by family issues (Husband recently past away.). The treatment provided mild relief.       No Known Allergies Previous Medications   FLUTICASONE PROPIONATE, NASAL, NA    Place into the nose.   LEVONORGESTREL (MIRENA, 52 MG,) 20 MCG/24HR IUD    by Intrauterine route.   MONTELUKAST (SINGULAIR) 10 MG TABLET    Take by mouth.   OMEPRAZOLE (PRILOSEC) 40 MG CAPSULE    TAKE ONE CAPSULE BY MOUTH ONCE DAILY 30 MINUTES BEFORE MORNING MEAL   RANITIDINE (ZANTAC) 150 MG TABLET    Take 150 mg by mouth 2 (two) times daily.   SERTRALINE (ZOLOFT) 100 MG TABLET    Take 1.5 tablets (150 mg total) by mouth daily.   TRAZODONE (DESYREL) 50 MG TABLET    Take 0.5-1 tablets (25-50 mg total) by mouth at bedtime as needed for sleep.   TRIAMCINOLONE CREAM (KENALOG) 0.1 %    Apply 1 application topically 2 (two) times daily.    Review of Systems  Constitutional: Negative for malaise/fatigue.  HENT: Positive for voice change (Secondary to Reflux).   Gastrointestinal: Negative.   Genitourinary: Negative.   Neurological: Negative for dizziness, light-headedness and headaches.  Psychiatric/Behavioral: Positive for sleep disturbance. Negative for hallucinations, behavioral problems, confusion, dysphoric mood, decreased  concentration and agitation. The patient has insomnia. The patient is not nervous/anxious and is not hyperactive.     Social History  Substance Use Topics  . Smoking status: Never Smoker   . Smokeless tobacco: Never Used  . Alcohol Use: No   Objective:   BP 112/64 mmHg  Pulse 80  Temp(Src) 98.7 F (37.1 C) (Oral)  Resp 16  Wt 207 lb (93.895 kg)  Physical Exam  Constitutional: She appears well-developed and well-nourished.  Cardiovascular: Normal rate and regular rhythm.   Pulmonary/Chest: Effort normal.  Genitourinary: Vagina normal. No vaginal discharge found.  Skin: Rash noted.  Rash on neck. Erythematous. Some excoriation.         Assessment & Plan:     1. STD exposure Will check labs as noted.  - Pap IG, CT/NG NAA, and HPV (high risk) - HIV antibody (with reflex) - RPR - Hepatitis C Antibody  2. Skin rash New problem.  Suspect related to anxiety. Also still with sleep issues. Will add Doxepin as below. Will refer to derm if does not improve. Continue Triamcinolone cream.    3. Urticaria Condition is worsening. Will start medication for better control.   - doxepin (SINEQUAN) 25 MG capsule; Take 1 capsule (25 mg total) by mouth at bedtime.  Dispense: 30 capsule; Refill: 5  4. Insomnia Not controlled on Trazodone. Will add Doxepin secondary to scratching.  Consider increasing Trazodone if this does not work. Patient will cal.  Patient was seen and examined by Jerrell Belfast, MD, and note scribed by Ashley Royalty, CMA.  I have reviewed the document for accuracy and completeness and I agree with above. - Jerrell Belfast, MD  Margarita Rana, MD  [facility]

## 2015-02-25 LAB — HIV ANTIBODY (ROUTINE TESTING W REFLEX): HIV Screen 4th Generation wRfx: NONREACTIVE

## 2015-02-25 LAB — RPR: RPR Ser Ql: NONREACTIVE

## 2015-02-25 LAB — HEPATITIS C ANTIBODY: Hep C Virus Ab: <0.1 s/co ratio (ref 0.0–0.9)

## 2015-02-27 ENCOUNTER — Telehealth: Payer: Self-pay

## 2015-02-27 NOTE — Telephone Encounter (Signed)
-----   Message from Margarita Rana, MD sent at 02/25/2015  8:56 AM EST ----- Blood work normal. Still awaiting PAP results. Thanks.

## 2015-02-27 NOTE — Telephone Encounter (Signed)
Patient advised as directed below. Expressed understanding.  Thanks,  -Melissa Tomaselli

## 2015-03-07 ENCOUNTER — Telehealth: Payer: Self-pay | Admitting: Family Medicine

## 2015-03-07 LAB — PAP IG, CT-NG NAA, HPV HIGH-RISK
Chlamydia, Nuc. Acid Amp: NEGATIVE
Gonococcus by Nucleic Acid Amp: NEGATIVE
HPV, HIGH-RISK: NEGATIVE
PAP SMEAR COMMENT: 0

## 2015-03-07 NOTE — Telephone Encounter (Signed)
Pt called wanting lab results form last week.  She also wanted to let you know that the odor you two talked about is still present.  Call back is [phone]  Thanks , Con Memos

## 2015-03-07 NOTE — Telephone Encounter (Signed)
Please check on PAP. Thanks.

## 2015-03-07 NOTE — Telephone Encounter (Signed)
[facility] will be sending over results. Spoke with Ivin Booty.

## 2015-03-08 ENCOUNTER — Telehealth: Payer: Self-pay

## 2015-03-08 DIAGNOSIS — N760 Acute vaginitis: Principal | ICD-10-CM

## 2015-03-08 DIAGNOSIS — B9689 Other specified bacterial agents as the cause of diseases classified elsewhere: Secondary | ICD-10-CM

## 2015-03-08 MED ORDER — METRONIDAZOLE 0.75 % VA GEL: 1.0000 | Freq: Two times a day (BID) | VAGINAL | Status: DC

## 2015-03-08 NOTE — Telephone Encounter (Signed)
Advised patient of results. Sent in medication into patient's pharmacy. Instructed patient to call if not improving.

## 2015-03-08 NOTE — Telephone Encounter (Signed)
Left message to call back.   Notes Recorded by Margarita Rana, MD on 03/07/2015 at 7:04 PM Pap normal. No GC or Chlamydia. Please clarify if patient has tried treatment for Bacterial Vaginosis.  Would recommend treat with Metrogel bid for 5 days. Thanks

## 2015-05-23 ENCOUNTER — Telehealth: Payer: Self-pay

## 2015-05-23 ENCOUNTER — Ambulatory Visit (INDEPENDENT_AMBULATORY_CARE_PROVIDER_SITE_OTHER): Payer: No Typology Code available for payment source | Admitting: Family Medicine

## 2015-05-23 ENCOUNTER — Encounter: Payer: Self-pay | Admitting: Family Medicine

## 2015-05-23 VITALS — BP 110/60 | HR 64 | Temp 98.4°F | Resp 16 | Wt 208.0 lb

## 2015-05-23 DIAGNOSIS — L280 Lichen simplex chronicus: Secondary | ICD-10-CM

## 2015-05-23 DIAGNOSIS — R21 Rash and other nonspecific skin eruption: Secondary | ICD-10-CM | POA: Diagnosis not present

## 2015-05-23 MED ORDER — TERCONAZOLE 0.4 % VA CREA: 1.0000 | TOPICAL_CREAM | Freq: Every day | VAGINAL | Status: DC

## 2015-05-23 MED ORDER — DOXYCYCLINE HYCLATE 100 MG PO TABS: 100.0000 mg | ORAL_TABLET | Freq: Two times a day (BID) | ORAL | Status: DC

## 2015-05-23 NOTE — Progress Notes (Signed)
Subjective:     Patient ID: Stacey Underwood, female   DOB: May 09, 1975, 40 y.o.   MRN: [id number]  Chief Complaint  Patient presents with  . Rash    x 4 days. Itches, draining, painful, raised above skin. Located on abdomen.    Rash This is a new problem. The current episode started in the past 7 days (4 days ago. Late last week. ). The problem is unchanged (Very itchy. ). The affected locations include the abdomen. The rash is characterized by draining, itchiness, pain, redness and blistering (1-30mm pustules, itchy, drainage after breaking out, confined to abdomen, not spreading.  Area on chest resolved.  ). It is unknown (No changes. ) if there was an exposure to a precipitant. Associated symptoms include facial edema. Pertinent negatives include no fever, joint pain, shortness of breath or sore throat. (Has chills. ) Past treatments include moisturizer and topical steroids. The treatment provided mild relief. (History of previous rash on chest )   No recent illnesses. Daughter had pink eye, Scarlet fever 2-3 weeks ago.  Did have drainage from umbilicus.   Did have Doxepin for last rash. Some component of anxiety. Saw Dermatology. Resolved with steroid cream. d  Patient Active Problem List   Diagnosis Date Noted  . Urticaria 02/24/2015  . Insomnia 02/24/2015  . Vaginal candidiasis 12/08/2014  . Bacterial vaginosis 10/25/2014  . Acne 10/20/2014  . Allergic reaction 10/20/2014  . Allergic rhinitis 10/20/2014  . Absolute anemia 10/20/2014  . Anxiety 10/20/2014  . Cervical cyst 10/20/2014  . Clinical depression 10/20/2014  . Hypercholesteremia 10/20/2014  . 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 Leiomyoma of uterus 10/20/2014  . Avitaminosis D 10/20/2014  . Vaginal discharge 10/20/2014  . Skin rash 10/20/2014  . 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    FLUTICASONE PROPIONATE, NASAL, NA    Place into the nose. Reported on 05/23/2015   LEVONORGESTREL (MIRENA, 52 MG,) 20 MCG/24HR IUD    by Intrauterine route.   MONTELUKAST (SINGULAIR) 10 MG TABLET    Take by mouth. Reported on 05/23/2015   NORETHINDRONE-ETHINYL ESTRADIOL-IRON (ESTROSTEP FE,TILIA FE,TRI-LEGEST FE) 1-20/1-30/1-35 MG-MCG TABLET    Take 1 tablet by mouth daily.   OMEPRAZOLE (PRILOSEC) 40 MG CAPSULE    TAKE ONE CAPSULE BY MOUTH ONCE DAILY 30 MINUTES BEFORE MORNING MEAL   RANITIDINE (ZANTAC) 150 MG TABLET    Take 150 mg by mouth 2 (two) times daily.   SERTRALINE (ZOLOFT) 100 MG TABLET    Take 1.5 tablets (150 mg total) by mouth daily.   TRAZODONE (DESYREL) 50 MG TABLET    Take 0.5-1 tablets (25-50 mg total) by mouth at bedtime as needed for sleep.   No Known Allergies Past Surgical History  Procedure Laterality Date  . Carpal tunnel release Bilateral 04/2014   Family History  Problem Relation Age of Onset  . Healthy Mother   . Healthy Father   . Healthy Brother    Social History   Social History  . Marital Status: Divorced    Spouse Name: N/A  . Number of Children: N/A  . Years of Education: N/A   Occupational History  . Not on file.   Social History Main Topics  . Smoking status: Never Smoker   . Smokeless tobacco: Never Used  . Alcohol Use: No  . Drug Use: No  . Sexual  Activity: Not on file   Other Topics Concern  . Not on file   Social History Narrative     Review of Systems  Constitutional: Negative.  Negative for fever.  HENT: Negative.  Negative for sore throat.   Respiratory: Negative.  Negative for shortness of breath.   Cardiovascular: Negative.   Genitourinary: Negative.   Musculoskeletal: Negative for myalgias, joint pain and arthralgias.  Skin: Positive for rash.       Objective:   Physical Exam  Constitutional: She is oriented to person, place, and time. She appears well-developed and well-nourished.  Pulmonary/Chest: Effort normal and  breath sounds normal.  Abdominal: Soft.  Musculoskeletal: Normal range of motion.  Neurological: She is alert and oriented to person, place, and time. She exhibits normal muscle tone.  Skin: Skin is warm and dry. Rash (Combined to abdomen. Multiple papules, 1 to 2 mm in diiffernent starges of healing. Wilth erythema around them.  ) noted.  Psychiatric: She has a normal mood and affect.  Vitals reviewed.   BP 110/60 mmHg  Pulse 64  Temp(Src) 98.4 F (36.9 C) (Oral)  Resp 16  Wt 208 lb (94.348 kg)  LMP 04/30/2015     Assessment:     Rash.     Plan:     1. Skin rash New problem. Worsening. Will treat for secondary infection. Unclear etiology.  Further plan pending these results.   - doxycycline (VIBRA-TABS) 100 MG tablet; Take 1 tablet (100 mg total) by mouth 2 (two) times daily.  Dispense: 14 tablet; Refill: 0 - terconazole (TERAZOL 7) 0.4 % vaginal cream; Place 1 applicator vaginally at bedtime.  Dispense: 45 g; Refill: 0  2. Neurodermatitis As above.   Margarita Rana, MD

## 2015-05-23 NOTE — Telephone Encounter (Signed)
Pt called c/o rash located under breast and across stomach (both right and left side). The rash is red, raised above the skin, itches and is sore. States that some of the lesions are draining. Also states there is drainage coming from belly button, with a foul smell. Pt coming in at 10:45am. Renaldo Fiddler, CMA

## 2015-06-08 ENCOUNTER — Telehealth: Payer: Self-pay | Admitting: Family Medicine

## 2015-06-18 ENCOUNTER — Other Ambulatory Visit: Payer: Self-pay | Admitting: Family Medicine

## 2015-06-18 DIAGNOSIS — G4700 Insomnia, unspecified: Secondary | ICD-10-CM

## 2015-06-22 ENCOUNTER — Encounter: Payer: Self-pay | Admitting: Family Medicine

## 2015-06-22 ENCOUNTER — Ambulatory Visit (INDEPENDENT_AMBULATORY_CARE_PROVIDER_SITE_OTHER): Payer: No Typology Code available for payment source | Admitting: Family Medicine

## 2015-06-22 VITALS — BP 110/52 | HR 80 | Temp 98.3°F | Resp 16 | Wt 208.0 lb

## 2015-06-22 DIAGNOSIS — G4700 Insomnia, unspecified: Secondary | ICD-10-CM

## 2015-06-22 DIAGNOSIS — H5710 Ocular pain, unspecified eye: Secondary | ICD-10-CM | POA: Insufficient documentation

## 2015-06-22 DIAGNOSIS — F419 Anxiety disorder, unspecified: Secondary | ICD-10-CM | POA: Diagnosis not present

## 2015-06-22 DIAGNOSIS — H5713 Ocular pain, bilateral: Secondary | ICD-10-CM

## 2015-06-22 DIAGNOSIS — E7800 Pure hypercholesterolemia, unspecified: Secondary | ICD-10-CM | POA: Diagnosis not present

## 2015-06-22 MED ORDER — SERTRALINE HCL 100 MG PO TABS: 200.0000 mg | ORAL_TABLET | Freq: Every day | ORAL | Status: DC

## 2015-06-22 MED ORDER — TRAZODONE HCL 100 MG PO TABS: 100.0000 mg | ORAL_TABLET | Freq: Every day | ORAL | Status: DC

## 2015-06-22 NOTE — Progress Notes (Signed)
Subjective:    Patient ID: Stacey Underwood, female    DOB: 02-Apr-1976, 40 y.o.   MRN: [id number]  Headache  The current episode started more than 1 month ago (x several months after ex-husband passed; H/A has worsened over the last week). The problem has been gradually worsening. The pain is located in the retro-orbital region. The pain does not radiate. The quality of the pain is described as squeezing. The pain is at a severity of 6/10 (Has gotten up to a 9/10). Pain severity now: moderate to severe. Associated symptoms include dizziness, eye pain, insomnia, nausea and photophobia. Pertinent negatives include no abdominal pain, back pain, blurred vision, coughing, ear pain, eye redness, eye watering, loss of balance, muscle aches, neck pain, numbness, phonophobia, rhinorrhea, sinus pressure, sore throat, visual change, vomiting or weight loss. The symptoms are aggravated by emotional stress. She has tried NSAIDs and acetaminophen for the symptoms. The treatment provided moderate relief.  Pt had similar H/A to this several months ago. Pt was examined by [facility], and was diagnosed with an infected eye duct on her right eye, which caused right eye blepharitis. Pt was referred to [facility], who prescribed pt an eye cream, without relief. Pt then saw the optometrist at [facility]. Pt was started on fluorometholone eye drops, with relief of eye sx. Pt states her H/A has worsened since her eye swelling has improved.    Review of Systems  Constitutional: Negative for weight loss.  HENT: Negative for ear pain, rhinorrhea, sinus pressure and sore throat.   Eyes: Positive for photophobia and pain. Negative for blurred vision and redness.  Respiratory: Negative for cough.   Gastrointestinal: Positive for nausea. Negative for vomiting and abdominal pain.  Musculoskeletal: Negative for back pain and neck pain.  Neurological: Positive for dizziness and headaches. Negative for numbness and loss  of balance.  Psychiatric/Behavioral: The patient has insomnia.    BP 110/52 mmHg  Pulse 80  Temp(Src) 98.3 F (36.8 C) (Oral)  Resp 16  Wt 208 lb (94.348 kg)  LMP 06/18/2015   Patient Active Problem List   Diagnosis Date Noted  . 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 Urticaria 02/24/2015  . Insomnia 02/24/2015  . Vaginal candidiasis 12/08/2014  . Bacterial vaginosis 10/25/2014  . Acne 10/20/2014  . Allergic reaction 10/20/2014  . Allergic rhinitis 10/20/2014  . Absolute anemia 10/20/2014  . Anxiety 10/20/2014  . Cervical cyst 10/20/2014  . Clinical depression 10/20/2014  . Hypercholesteremia 10/20/2014  . 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 Leiomyoma of uterus 10/20/2014  . Avitaminosis D 10/20/2014  . Vaginal discharge 10/20/2014  . Skin rash 10/20/2014  . 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 Current Outpatient Prescriptions on File Prior to Visit  Medication Sig  . doxepin (SINEQUAN) 25 MG capsule Take 1 capsule (25 mg total) by mouth at bedtime.  Marland Kitchen FLUTICASONE PROPIONATE, NASAL, NA Place into the nose. Reported on 05/23/2015  . levonorgestrel (MIRENA, 52 MG,) 20 MCG/24HR IUD by Intrauterine route.  . montelukast (SINGULAIR) 10 MG tablet Take by mouth. Reported on 05/23/2015  . norethindrone-ethinyl estradiol-iron (ESTROSTEP FE,TILIA FE,TRI-LEGEST FE) 1-20/1-30/1-35 MG-MCG tablet Take 1 tablet by mouth daily.  Marland Kitchen omeprazole (PRILOSEC) 40 MG capsule TAKE ONE CAPSULE BY MOUTH ONCE DAILY 30 MINUTES BEFORE MORNING MEAL  . ranitidine (ZANTAC) 150 MG tablet Take 150 mg by mouth 2 (two) times daily.  . sertraline (ZOLOFT) 100 MG tablet Take 1.5 tablets (150 mg total)  by mouth daily.  . traZODone (DESYREL) 50 MG tablet TAKE 1/2 TO 1 TABLETS (25-50 MG TOTAL) BY MOUTH AT BEDTIME AS NEEDED FOR SLEEP.   No current facility-administered medications on file prior to visit.   No Known Allergies Past Surgical History  Procedure  Laterality Date  . Carpal tunnel release Bilateral 04/2014   Social History   Social History  . Marital Status: Divorced    Spouse Name: N/A  . Number of Children: N/A  . Years of Education: N/A   Occupational History  . Not on file.   Social History Main Topics  . Smoking status: Never Smoker   . Smokeless tobacco: Never Used  . Alcohol Use: No  . Drug Use: No  . Sexual Activity: Not on file   Other Topics Concern  . Not on file   Social History Narrative   Family History  Problem Relation Age of Onset  . Healthy Mother   . Healthy Father   . Healthy Brother       Objective:   Physical Exam  Constitutional: She appears well-developed and well-nourished.  HENT:  Head: Normocephalic and atraumatic.  Right Ear: External ear normal.  Left Ear: External ear normal.  Nose: Nose normal.  Mouth/Throat: Oropharynx is clear and moist. No oropharyngeal exudate.  Eyes: Conjunctivae and EOM are normal. Pupils are equal, round, and reactive to light. Right eye exhibits no discharge. Left eye exhibits no discharge. No scleral icterus.  Psychiatric: Her behavior is normal.  Pt tearful in office, but talks openly about problems   BP 110/52 mmHg  Pulse 80  Temp(Src) 98.3 F (36.8 C) (Oral)  Resp 16  Wt 208 lb (94.348 kg)  LMP 06/18/2015     Assessment & Plan:  1. Anxiety Not controlled. Will increase medication.  Has not really dealt with the death of her ex-husband. Tearful throughout interview.  Has a lot of unresolved issues.  Encouraged follow up with therapist.   - sertraline (ZOLOFT) 100 MG tablet; Take 2 tablets (200 mg total) by mouth daily.  Dispense: 60 tablet; Refill: 6  2. Insomnia Worsening. Increase medication.   - traZODone (DESYREL) 100 MG tablet; Take 1 tablet (100 mg total) by mouth at bedtime.  Dispense: 30 tablet; Refill: 6  3. Hypercholesteremia Stable.   4. Eye pain, bilateral New problem. Worsening. Will refer.   - Ambulatory referral to  Ophthalmology   Patient was seen and examined by Jerrell Belfast, MD, and note scribed by Renaldo Fiddler, CMA. I have reviewed the document for accuracy and completeness and I agree with above. Jerrell Belfast, MD   Margarita Rana, MD

## 2015-06-28 ENCOUNTER — Other Ambulatory Visit: Payer: Self-pay | Admitting: Family Medicine

## 2015-06-28 DIAGNOSIS — J309 Allergic rhinitis, unspecified: Secondary | ICD-10-CM

## 2015-07-20 ENCOUNTER — Ambulatory Visit: Payer: No Typology Code available for payment source | Admitting: Family Medicine

## 2015-08-23 ENCOUNTER — Ambulatory Visit (INDEPENDENT_AMBULATORY_CARE_PROVIDER_SITE_OTHER): Payer: No Typology Code available for payment source | Admitting: Family Medicine

## 2015-08-23 ENCOUNTER — Encounter: Payer: Self-pay | Admitting: Family Medicine

## 2015-08-23 VITALS — BP 106/70 | HR 88 | Temp 98.3°F | Resp 16 | Ht 61.0 in | Wt 189.0 lb

## 2015-08-23 DIAGNOSIS — Z0000 Encounter for general adult medical examination without abnormal findings: Secondary | ICD-10-CM | POA: Diagnosis not present

## 2015-08-23 DIAGNOSIS — E7800 Pure hypercholesterolemia, unspecified: Secondary | ICD-10-CM

## 2015-08-23 DIAGNOSIS — E559 Vitamin D deficiency, unspecified: Secondary | ICD-10-CM | POA: Diagnosis not present

## 2015-08-23 DIAGNOSIS — R7303 Prediabetes: Secondary | ICD-10-CM

## 2015-08-23 DIAGNOSIS — D508 Other iron deficiency anemias: Secondary | ICD-10-CM | POA: Diagnosis not present

## 2015-08-23 DIAGNOSIS — F419 Anxiety disorder, unspecified: Secondary | ICD-10-CM | POA: Diagnosis not present

## 2015-08-23 NOTE — Patient Instructions (Addendum)
Discontinue Doxepin.

## 2015-08-23 NOTE — Progress Notes (Signed)
Patient ID: Stacey Underwood, female   DOB: 1975-05-07, 40 y.o.   MRN: [id number]       Patient: Stacey Underwood, Female    DOB: 07-Apr-1976, 40 y.o.   MRN: [id number] Visit Date: 08/23/2015  Today's Provider: Margarita Rana, MD   Chief Complaint  Patient presents with  . Annual Exam   Subjective:    Annual physical exam Stacey Underwood is a 40 y.o. female who presents today for health maintenance and complete physical. She feels well. She reports exercising daily. She reports she is sleeping fairly well.  01/20/13 CPE 02/24/15 Pap-neg; GC and chlamydia-neg 01/20/13 HPV-neg  -----------------------------------------------------------------   Review of Systems  Constitutional: Negative.   HENT: Negative.   Eyes: Negative.   Respiratory: Negative.   Cardiovascular: Negative.   Gastrointestinal: Negative.   Endocrine: Negative.   Genitourinary: Negative.   Musculoskeletal: Negative.   Skin: Positive for rash.  Allergic/Immunologic: Negative.   Neurological: Negative.   Hematological: Negative.   Psychiatric/Behavioral: Negative.     Social History      She  reports that she has never smoked. She has never used smokeless tobacco. She reports that she does not drink alcohol or use illicit drugs.       Social History   Social History  . Marital Status: Divorced    Spouse Name: N/A  . Number of Children: N/A  . Years of Education: N/A   Social History Main Topics  . Smoking status: Never Smoker   . Smokeless tobacco: Never Used  . Alcohol Use: No  . Drug Use: No  . Sexual Activity: Not Asked   Other Topics Concern  . None   Social History Narrative    History reviewed. No pertinent past medical history.   Patient Active Problem List   Diagnosis Date Noted  . Eye pain 06/22/2015  . 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 Urticaria 02/24/2015  . Insomnia 02/24/2015  . Vaginal candidiasis 12/08/2014  . Bacterial vaginosis 10/25/2014  . Acne 10/20/2014  .  Allergic reaction 10/20/2014  . Allergic rhinitis 10/20/2014  . Absolute anemia 10/20/2014  . Anxiety 10/20/2014  . Cervical cyst 10/20/2014  . Clinical depression 10/20/2014  . Hypercholesteremia 10/20/2014  . 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 Leiomyoma of uterus 10/20/2014  . Avitaminosis D 10/20/2014  . Vaginal discharge 10/20/2014  . Skin rash 10/20/2014  . 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 Carpal tunnel release Bilateral 04/2014    Family History        Family Status  Relation Status Death Age  . Mother Alive   . Father Alive   . Brother Alive         Her family history includes Healthy in her brother, father, and mother.    No Known Allergies  Previous Medications   DOXEPIN (SINEQUAN) 25 MG CAPSULE    Take 1 capsule (25 mg total) by mouth at bedtime.   FLUTICASONE (FLONASE) 50 MCG/ACT NASAL SPRAY    SPRAY TWICE IN EACH NOSTRIL ONCE DAILY   LEVONORGESTREL (MIRENA, 52 MG,) 20 MCG/24HR IUD    by Intrauterine route.   MONTELUKAST (SINGULAIR) 10 MG TABLET    TAKE 1 TABLET BY MOUTH ONCE A DAY AS NEEDED   OMEPRAZOLE (PRILOSEC) 40 MG CAPSULE    TAKE ONE CAPSULE BY MOUTH ONCE DAILY 30 MINUTES BEFORE MORNING MEAL   PHENTERMINE (ADIPEX-P) 37.5 MG TABLET    Take 37.5 mg  by mouth daily before breakfast.   RANITIDINE (ZANTAC) 150 MG TABLET    Take 150 mg by mouth 2 (two) times daily.   SERTRALINE (ZOLOFT) 100 MG TABLET    Take 2 tablets (200 mg total) by mouth daily.   TOPIRAMATE (TOPAMAX) 50 MG TABLET    Take 50 mg by mouth 2 (two) times daily.   TRAZODONE (DESYREL) 100 MG TABLET    Take 1 tablet (100 mg total) by mouth at bedtime.    Patient Care Team: Stacey Rana, MD as PCP - General (Family Medicine) Cletis Athens, MD (Internal Medicine) Seeplaputhur Robinette Haines, MD (General Surgery)     Objective:   Vitals: BP 106/70 mmHg  Pulse 88  Temp(Src) 98.3 F (36.8 C) (Oral)  Resp 16  Ht 5'  1" (1.549 m)  Wt 189 lb (85.73 kg)  BMI 35.73 kg/m2  LMP 08/15/2015   Physical Exam  Constitutional: She is oriented to person, place, and time. She appears well-developed and well-nourished.  HENT:  Head: Normocephalic and atraumatic.  Right Ear: Tympanic membrane, external ear and ear canal normal.  Left Ear: Tympanic membrane, external ear and ear canal normal.  Nose: Nose normal.  Mouth/Throat: Uvula is midline, oropharynx is clear and moist and mucous membranes are normal.  Eyes: Conjunctivae, EOM and lids are normal. Pupils are equal, round, and reactive to light.  Neck: Trachea normal and normal range of motion. Neck supple. Carotid bruit is not present. No thyroid mass and no thyromegaly present.  Cardiovascular: Normal rate, regular rhythm and normal heart sounds.   Pulmonary/Chest: Effort normal and breath sounds normal.  Abdominal: Soft. Normal appearance and bowel sounds are normal. There is no hepatosplenomegaly. There is no tenderness.  Genitourinary: No breast swelling, tenderness or discharge.  Musculoskeletal: Normal range of motion.  Lymphadenopathy:    She has no cervical adenopathy.    She has no axillary adenopathy.  Neurological: She is alert and oriented to person, place, and time. She has normal strength. No cranial nerve deficit.  Skin: Skin is warm, dry and intact.  Psychiatric: She has a normal mood and affect. Her speech is normal and behavior is normal. Judgment and thought content normal. Cognition and memory are normal.    Depression Screen PHQ 2/9 Scores 08/23/2015  PHQ - 2 Score 0    Assessment & Plan:     Routine Health Maintenance and Physical Exam  Exercise Activities and Dietary recommendations Goals    None      Immunization History  Administered Date(s) Administered  . Influenza,inj,Quad PF,36+ Mos 01/25/2015  . Td 01/10/2011      1. Annual physical exam Stable. Patient advised to continue eating healthy and exercise  daily.  2. Borderline diabetes - Hemoglobin A1c  3. Other iron deficiency anemias - CBC with Differential/Platelet  4. Anxiety - TSH  5. Avitaminosis D - VITAMIN D 25 Hydroxy (Vit-D Deficiency, Fractures)  6. Hypercholesteremia - Comprehensive metabolic panel - Lipid Panel With LDL/HDL Ratio   Patient seen and examined by Dr. Jerrell Belfast, and note scribed by Philbert Riser. Dim[place], CMA.  I have reviewed the document for accuracy and completeness and I agree with above. Jerrell Belfast, MD   Stacey Rana, MD   --------------------------------------------------------------------

## 2015-11-15 NOTE — Telephone Encounter (Signed)
error 

## 2015-12-28 ENCOUNTER — Encounter
Admission: RE | Admit: 2015-12-28 | Discharge: 2015-12-28 | Disposition: A | Discharge status: Home / Self Care | Payer: No Typology Code available for payment source | Source: Ambulatory Visit | Attending: Otolaryngology | Admitting: Otolaryngology

## 2015-12-28 HISTORY — DX: Family history of other specified conditions: Z84.89

## 2015-12-28 HISTORY — DX: Gastro-esophageal reflux disease without esophagitis: K21.9

## 2015-12-28 HISTORY — DX: Anemia, unspecified: D64.9

## 2015-12-28 HISTORY — DX: Headache, unspecified: R51.9

## 2015-12-28 HISTORY — DX: Headache: R51

## 2015-12-28 NOTE — Patient Instructions (Signed)
  Your procedure is scheduled on: 01-04-16 Report to Same Day Surgery 2nd floor medical mall To find out your arrival time please call [phone] between 1PM - 3PM on 01-03-16  Remember: Instructions that are not followed completely may result in serious medical risk, up to and including death, or upon the discretion of your surgeon and anesthesiologist your surgery may need to be rescheduled.    _x___ 1. Do not eat food or drink liquids after midnight. No gum chewing or hard candies.     __x__ 2. No Alcohol for 24 hours before or after surgery.   __x__3. No Smoking for 24 prior to surgery.   ____  4. Bring all medications with you on the day of surgery if instructed.    __x__ 5. Notify your doctor if there is any change in your medical condition     (cold, fever, infections).     Do not wear jewelry, make-up, hairpins, clips or nail polish.  Do not wear lotions, powders, or perfumes. You may wear deodorant.  Do not shave 48 hours prior to surgery. Men may shave face and neck.  Do not bring valuables to the hospital.    [facility] is not responsible for any belongings or valuables.               Contacts, dentures or bridgework may not be worn into surgery.  Leave your suitcase in the car. After surgery it may be brought to your room.  For patients admitted to the hospital, discharge time is determined by your treatment team.   Patients discharged the day of surgery will not be allowed to drive home.    Please read over the following fact sheets that you were given:   [facility] Preparing for Surgery and or MRSA Information   _x___ Take these medicines the morning of surgery with A SIP OF WATER:    1. TAKE PRILOSEC AND ZANTAC AM OF SURGERY  2.  3.  4.  5.  6.  ____Fleets enema or Magnesium Citrate as directed.   ____ Use CHG Soap or sage wipes as directed on instruction sheet   ____ Use inhalers on the day of surgery and bring to hospital day of surgery  ____ Stop  metformin 2 days prior to surgery    ____ Take 1/2 of usual insulin dose the night before surgery and none on the morning of surgery.   ____ Stop aspirin or coumadin, or plavix  x__ Stop Anti-inflammatories such as Advil, Aleve, Ibuprofen, Motrin, Naproxen,          Naprosyn, Goodies powders or aspirin products. Ok to take Tylenol.   ____ Stop supplements until after surgery.    ____ Bring C-Pap to the hospital.

## 2016-01-02 NOTE — Pre-Procedure Instructions (Signed)
Becky from Dr. Darien Ramus office called back, the preop orders and H+P are typed and awaiting Dr. Darien Ramus signature on his desk.  Jacqlyn Larsen will fax over the above when they have been signed, hopefully tomorrow morning.

## 2016-01-02 NOTE — Pre-Procedure Instructions (Signed)
Called Becky at Dr. Darien Ramus office requesting pre-op orders for pt, surgery on 12/09/15, message left on her voicemail.

## 2016-01-04 ENCOUNTER — Ambulatory Visit: Payer: No Typology Code available for payment source | Admitting: Anesthesiology

## 2016-01-04 ENCOUNTER — Encounter: Payer: Self-pay | Admitting: *Deleted

## 2016-01-04 ENCOUNTER — Encounter
Admission: RE | Disposition: A | Discharge status: Home / Self Care | Payer: Self-pay | Source: Ambulatory Visit | Attending: Otolaryngology

## 2016-01-04 ENCOUNTER — Ambulatory Visit
Admission: RE | Admit: 2016-01-04 | Discharge: 2016-01-04 | Disposition: A | Discharge status: Home / Self Care | Payer: No Typology Code available for payment source | Source: Ambulatory Visit | Attending: Otolaryngology | Admitting: Otolaryngology

## 2016-01-04 DIAGNOSIS — F329 Major depressive disorder, single episode, unspecified: Secondary | ICD-10-CM | POA: Insufficient documentation

## 2016-01-04 DIAGNOSIS — Z793 Long term (current) use of hormonal contraceptives: Secondary | ICD-10-CM | POA: Insufficient documentation

## 2016-01-04 DIAGNOSIS — K219 Gastro-esophageal reflux disease without esophagitis: Secondary | ICD-10-CM | POA: Diagnosis not present

## 2016-01-04 DIAGNOSIS — J383 Other diseases of vocal cords: Secondary | ICD-10-CM | POA: Diagnosis present

## 2016-01-04 DIAGNOSIS — Z79899 Other long term (current) drug therapy: Secondary | ICD-10-CM | POA: Insufficient documentation

## 2016-01-04 HISTORY — PX: DIRECT LARYNGOSCOPY: SHX5326

## 2016-01-04 LAB — POCT PREGNANCY, URINE: Preg Test, Ur: NEGATIVE

## 2016-01-04 SURGERY — LARYNGOSCOPY, DIRECT
Anesthesia: General | Laterality: Right

## 2016-01-04 MED ORDER — DEXAMETHASONE SODIUM PHOSPHATE 10 MG/ML IJ SOLN
INTRAMUSCULAR | Status: DC | PRN
  Administered 2016-01-04: 10 mg via INTRAVENOUS

## 2016-01-04 MED ORDER — ROCURONIUM BROMIDE 100 MG/10ML IV SOLN
INTRAVENOUS | Status: DC | PRN
  Administered 2016-01-04: 30 mg via INTRAVENOUS

## 2016-01-04 MED ORDER — LIDOCAINE HCL (PF) 4 % IJ SOLN
INTRAMUSCULAR | Status: DC | PRN
  Administered 2016-01-04: .25 mL

## 2016-01-04 MED ORDER — ONDANSETRON HCL 4 MG/2ML IJ SOLN: 4.0000 mg | Freq: Once | INTRAMUSCULAR | Status: DC | PRN

## 2016-01-04 MED ORDER — LIDOCAINE HCL (PF) 4 % IJ SOLN
INTRAMUSCULAR | Status: AC
  Filled 2016-01-04: qty 5

## 2016-01-04 MED ORDER — ONDANSETRON HCL 4 MG/2ML IJ SOLN
INTRAMUSCULAR | Status: DC | PRN
  Administered 2016-01-04: 4 mg via INTRAVENOUS

## 2016-01-04 MED ORDER — MIDAZOLAM HCL 2 MG/2ML IJ SOLN
INTRAMUSCULAR | Status: DC | PRN
Start: 2016-01-04 — End: 2016-01-04
  Administered 2016-01-04: 2 mg via INTRAVENOUS

## 2016-01-04 MED ORDER — LIDOCAINE HCL (CARDIAC) 20 MG/ML IV SOLN
INTRAVENOUS | Status: DC | PRN
  Administered 2016-01-04: 50 mg via INTRAVENOUS

## 2016-01-04 MED ORDER — PROMETHAZINE HCL 12.5 MG PO TABS: 12.5000 mg | ORAL_TABLET | Freq: Four times a day (QID) | ORAL | 0 refills | Status: DC | PRN

## 2016-01-04 MED ORDER — FENTANYL CITRATE (PF) 100 MCG/2ML IJ SOLN
INTRAMUSCULAR | Status: AC
  Filled 2016-01-04: qty 2

## 2016-01-04 MED ORDER — LACTATED RINGERS IV SOLN
INTRAVENOUS | Status: DC
  Administered 2016-01-04: 08:00:00 via INTRAVENOUS

## 2016-01-04 MED ORDER — FENTANYL CITRATE (PF) 100 MCG/2ML IJ SOLN
INTRAMUSCULAR | Status: DC | PRN
  Administered 2016-01-04 (×2): 50 ug via INTRAVENOUS

## 2016-01-04 MED ORDER — SUGAMMADEX SODIUM 200 MG/2ML IV SOLN
INTRAVENOUS | Status: DC | PRN
  Administered 2016-01-04: 160 mg via INTRAVENOUS

## 2016-01-04 MED ORDER — FENTANYL CITRATE (PF) 100 MCG/2ML IJ SOLN
25.0000 ug | INTRAMUSCULAR | Status: DC | PRN
  Administered 2016-01-04: 25 ug via INTRAVENOUS

## 2016-01-04 MED ORDER — PROPOFOL 10 MG/ML IV BOLUS
INTRAVENOUS | Status: DC | PRN
  Administered 2016-01-04: 180 mg via INTRAVENOUS

## 2016-01-04 MED ORDER — EPINEPHRINE HCL 1 MG/ML IJ SOLN
INTRAMUSCULAR | Status: AC
  Filled 2016-01-04: qty 1

## 2016-01-04 MED ORDER — HYDROCODONE-ACETAMINOPHEN 7.5-325 MG/15ML PO SOLN: 10.0000 mL | Freq: Four times a day (QID) | ORAL | 0 refills | Status: DC | PRN

## 2016-01-04 MED ORDER — OXYMETAZOLINE HCL 0.05 % NA SOLN
NASAL | Status: AC
  Filled 2016-01-04: qty 15

## 2016-01-04 SURGICAL SUPPLY — 13 items
CUP MEDICINE 2OZ PLAST GRAD ST (MISCELLANEOUS) ×2 IMPLANT
DRAPE TABLE BACK 80X90 (DRAPES) ×2 IMPLANT
DRESSING TELFA 4X3 1S ST N-ADH (GAUZE/BANDAGES/DRESSINGS) ×2 IMPLANT
GLOVE BIO SURGEON STRL SZ7.5 (GLOVE) ×2 IMPLANT
GOWN STRL REUS W/ TWL LRG LVL3 (GOWN DISPOSABLE) ×1 IMPLANT
GOWN STRL REUS W/TWL LRG LVL3 (GOWN DISPOSABLE) ×2
NDL SAFETY 18GX1.5 (NEEDLE) ×2 IMPLANT
PATTIES SURGICAL .5 X.5 (GAUZE/BANDAGES/DRESSINGS) ×2 IMPLANT
SOL ANTI-FOG 6CC FOG-OUT (MISCELLANEOUS) ×1 IMPLANT
SOL FOG-OUT ANTI-FOG 6CC (MISCELLANEOUS) ×1
SPONGE XRAY 4X4 16PLY STRL (MISCELLANEOUS) ×2 IMPLANT
TOWEL OR 17X26 4PK STRL BLUE (TOWEL DISPOSABLE) ×2 IMPLANT
TUBING CONNECTING 10 (TUBING) ×2 IMPLANT

## 2016-01-04 NOTE — Anesthesia Postprocedure Evaluation (Signed)
Anesthesia Post Note  Patient: Stacey Underwood  Procedure(s) Performed: Procedure(s) (LRB): DIRECT LARYNGOSCOPY WITH MICRO FLAP EXCISION (Right)  Patient location during evaluation: PACU Anesthesia Type: General Level of consciousness: awake and alert and oriented Pain management: pain level controlled Vital Signs Assessment: post-procedure vital signs reviewed and stable Respiratory status: spontaneous breathing Cardiovascular status: blood pressure returned to baseline Anesthetic complications: no    Last Vitals:  Vitals:   01/04/16 0940 01/04/16 1022  BP: 112/67 110/71  Pulse: 61 64  Resp: 16 16  Temp: 36.9 C     Last Pain:  Vitals:   01/04/16 1022  TempSrc:   PainSc: 0-No pain                 Damon Hargrove

## 2016-01-04 NOTE — Discharge Instructions (Signed)

## 2016-01-04 NOTE — Transfer of Care (Signed)
Immediate Anesthesia Transfer of Care Note  Patient: Stacey Underwood  Procedure(s) Performed: Procedure(s): DIRECT LARYNGOSCOPY WITH MICRO FLAP EXCISION (Right)  Patient Location: PACU  Anesthesia Type:General  Level of Consciousness: awake and alert   Airway & Oxygen Therapy: Patient Spontanous Breathing and Patient connected to face mask oxygen  Post-op Assessment: Report given to RN and Post -op Vital signs reviewed and stable  Post vital signs: Reviewed  Last Vitals:  Vitals:   01/04/16 0704 01/04/16 0859  BP: 118/71 101/67  Pulse: 73 85  Resp: 18 15  Temp: 36.7 C 36.2 C    Last Pain:  Vitals:   01/04/16 0704  TempSrc: Tympanic  PainSc: 0-No pain         Complications: No apparent anesthesia complications

## 2016-01-04 NOTE — H&P (Signed)
..  History and Physical paper copy reviewed and updated date of procedure and will be scanned into system.  

## 2016-01-04 NOTE — Anesthesia Preprocedure Evaluation (Signed)
Anesthesia Evaluation  Patient identified by MRN, date of birth, ID band Patient awake  General Assessment Comment:Grandmother has increased N/V  Reviewed: Allergy & Precautions, NPO status , Patient's Chart, lab work & pertinent test results  History of Anesthesia Complications (+) Family history of anesthesia reaction  Airway Mallampati: II  TM Distance: >3 FB     Dental  (+) Caps, Chipped   Pulmonary  Sinus allergies   Pulmonary exam normal        Cardiovascular negative cardio ROS Normal cardiovascular exam     Neuro/Psych  Headaches, PSYCHIATRIC DISORDERS Anxiety Depression    GI/Hepatic Neg liver ROS, GERD  Medicated and Controlled,  Endo/Other  negative endocrine ROS  Renal/GU negative Renal ROS     Musculoskeletal negative musculoskeletal ROS (+)   Abdominal Normal abdominal exam  (+)   Peds negative pediatric ROS (+)  Hematology  (+) anemia ,   Anesthesia Other Findings   Reproductive/Obstetrics negative OB ROS                             Anesthesia Physical Anesthesia Plan  ASA: II  Anesthesia Plan: General   Post-op Pain Management:    Induction: Intravenous  Airway Management Planned: Oral ETT  Additional Equipment:   Intra-op Plan:   Post-operative Plan: Extubation in OR  Informed Consent: I have reviewed the patients History and Physical, chart, labs and discussed the procedure including the risks, benefits and alternatives for the proposed anesthesia with the patient or authorized representative who has indicated his/her understanding and acceptance.   Dental advisory given  Plan Discussed with: CRNA and Surgeon  Anesthesia Plan Comments:         Anesthesia Quick Evaluation

## 2016-01-04 NOTE — Anesthesia Procedure Notes (Signed)
Procedure Name: Intubation Performed by: Rolla Plate Pre-anesthesia Checklist: Patient identified, Patient being monitored, Timeout performed, Emergency Drugs available and Suction available Patient Re-evaluated:Patient Re-evaluated prior to inductionOxygen Delivery Method: Circle system utilized Preoxygenation: Pre-oxygenation with 100% oxygen Intubation Type: IV induction Ventilation: Mask ventilation without difficulty Laryngoscope Size: Miller and 2 Grade View: Grade I Tube type: MLT Tube size: 6.0 mm Number of attempts: 1 Airway Equipment and Method: Stylet Placement Confirmation: ETT inserted through vocal cords under direct vision,  positive ETCO2 and breath sounds checked- equal and bilateral Secured at: 22 cm Tube secured with: Tape Dental Injury: Teeth and Oropharynx as per pre-operative assessment

## 2016-01-04 NOTE — Op Note (Signed)
....  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  Sulcus of left TVF as well with extensive scar bilaterally.  Microflap excision of right true vocal fold lesion.  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  Procedure: After the patient was identified in holding and the history and physical and consent was reviewed, the patient was taken to the operating room and placed in a supine position. General endotracheal anesthesia was induced in the normal fashion with MLT tube.  At this time, the patient was rotated 90 degrees and a shoulder roll was placed as well as well as a mouth guard.   At this time,Dedo laryngoscope was inserted into the patient's oral cavity. Visualization of the oral cavity, oropharynx, pharynx and larynx was made.  A magnified zero degree Hopkin's rod was used to evaluate the larynx. This demonstrated findings as above with a right vocal fold lesion.  At this time, the Dedo laryngoscope was suspended from the [facility] stand and the operating microscope was brought onto the field.   Under high power magnification, a micro cup forcep was used to gently grasp the mass and retract it medially. A sickle knife was used to gently incise the mucosa on the dorsal aspect of the vocal fold lesion. A microelevator was used to dissect the mass away from the superficial layer of the lamina propria.  The mass was then able to be easily removed.  The patient's entire airway was next evaluated for hemostasis and meticulous suctioning was performed. 0.25ccs of 4% lidocaine was sprayed  onto the epiglottis and larynx. The patient was released from suspension and the mouth gag and laryngoscope was removed from the patient's oral cavity without injury to teeth, lips, or gums.   Dispo:   To PACU in good condition  Plan:  Soft Diet, Voice rest for 5 days.   follow up in 1 week for repeat evaluation and review of pathology.  [name]  01/04/2016 8:54 AM

## 2016-01-06 LAB — SURGICAL PATHOLOGY

## 2016-02-27 ENCOUNTER — Encounter: Payer: Self-pay | Admitting: Physician Assistant

## 2016-02-27 ENCOUNTER — Ambulatory Visit (INDEPENDENT_AMBULATORY_CARE_PROVIDER_SITE_OTHER): Payer: No Typology Code available for payment source | Admitting: Physician Assistant

## 2016-02-27 VITALS — BP 102/64 | HR 74 | Temp 98.0°F | Resp 14 | Wt 187.0 lb

## 2016-02-27 DIAGNOSIS — L259 Unspecified contact dermatitis, unspecified cause: Secondary | ICD-10-CM | POA: Diagnosis not present

## 2016-02-27 DIAGNOSIS — N939 Abnormal uterine and vaginal bleeding, unspecified: Secondary | ICD-10-CM | POA: Diagnosis not present

## 2016-02-27 LAB — POCT URINE PREGNANCY: Preg Test, Ur: NEGATIVE

## 2016-02-27 MED ORDER — TRIAMCINOLONE ACETONIDE 0.1 % EX CREA: 1.0000 "application " | TOPICAL_CREAM | Freq: Two times a day (BID) | CUTANEOUS | 0 refills | Status: DC

## 2016-02-27 NOTE — Patient Instructions (Signed)
bAbnormal Uterine Bleeding Abnormal uterine bleeding means bleeding from the vagina that is not your normal menstrual period. This can be:  Bleeding or spotting between periods.  Bleeding after sex (sexual intercourse).  Bleeding that is heavier or more than normal.  Periods that last longer than usual.  Bleeding after menopause. There are many problems that may cause this. Treatment will depend on the cause of the bleeding. Any kind of bleeding that is not normal should be reviewed by your doctor. Follow these instructions at home: Watch your condition for any changes. These actions may lessen any discomfort you are having:  Do not use tampons or douches as told by your doctor.  Change your pads often. You should get regular pelvic exams and Pap tests. Keep all appointments for tests as told by your doctor. Contact a doctor if:  You are bleeding for more than 1 week.  You feel dizzy at times. Get help right away if:  You pass out.  You have to change pads every 15 to 30 minutes.  You have belly pain.  You have a fever.  You become sweaty or weak.  You are passing large blood clots from the vagina.  You feel sick to your stomach (nauseous) and throw up (vomit). This information is not intended to replace advice given to you by your health care provider. Make sure you discuss any questions you have with your health care provider. Document Released: 01/20/2009 Document Revised: 08/31/2015 Document Reviewed: 10/22/2012 Elsevier Interactive Patient Education  2017 [employer].

## 2016-02-27 NOTE — Progress Notes (Signed)
Patient: Stacey Underwood Female    DOB: 12/16/1975   40 y.o.   MRN: [id number] Visit Date: 02/27/2016  Today's Provider: Trinna Post, PA-C   Chief Complaint  Patient presents with  . Vaginal Bleeding    for 3 weeks   Subjective:    HPI Pt is a 40 year old female with uterine fibroids and an IUD here today for vaginal bleeding for 3 weeks. She reports that she started what she thought was her period on November 2nd  but she has continued to bleed for 3 weeks now. Pt also says that before she had her IUD she had passed blood clots with her period but since having the IUD she has not until this period. She reports she is certain this is vaginal bleeding because she uses tampons, and when tampon was inserted, there was no bleeding from other locations. She reports that as of this morning she was not bleeding anymore, but thought she should come on in because it is not normal to bleed for 3 weeks. She denies any cramping or pelvic pain. She has a history of fibroids but has not have any cramping or problems with them since she's had her IUD. No sexual trauma.   She would also like to discuss skin issues. She used to break out on just her face during her period but now it is her chest as well. She states this recently appeared several months ago. They do itch. No pus drainage. No bleeding unless she scratches them. Patient does have a history of skin rash in the past. Does not wish to see dermatology.     No Known Allergies   Current Outpatient Prescriptions:  .  fluticasone (FLONASE) 50 MCG/ACT nasal spray, SPRAY TWICE IN EACH NOSTRIL ONCE DAILY (Patient taking differently: SPRAY TWICE IN EACH NOSTRIL ONCE DAILY-PRN), Disp: 48 g, Rfl: 1 .  levonorgestrel (MIRENA, 52 MG,) 20 MCG/24HR IUD, by Intrauterine route., Disp: , Rfl:  .  montelukast (SINGULAIR) 10 MG tablet, TAKE 1 TABLET BY MOUTH ONCE A DAY AS NEEDED, Disp: 90 tablet, Rfl: 1 .  promethazine (PHENERGAN) 12.5 MG tablet, Take  1 tablet (12.5 mg total) by mouth every 6 (six) hours as needed for nausea or vomiting., Disp: 30 tablet, Rfl: 0 .  ranitidine (ZANTAC) 150 MG tablet, Take 150 mg by mouth every morning. , Disp: , Rfl:  .  DEXILANT 60 MG capsule, Take 1 capsule by mouth daily., Disp: , Rfl: 12 .  FLUVIRIN SUSP, ADM 0.5ML IM UTD, Disp: , Rfl: 0 .  HYDROcodone-acetaminophen (HYCET) 7.5-325 mg/15 ml solution, Take 10 mLs by mouth 4 (four) times daily as needed for moderate pain. (Patient not taking: Reported on 02/27/2016), Disp: 300 mL, Rfl: 0 .  sertraline (ZOLOFT) 100 MG tablet, Take by mouth., Disp: , Rfl:  .  triamcinolone cream (KENALOG) 0.1 %, Apply 1 application topically 2 (two) times daily. Use on affected area no longer than two weeks., Disp: 30 g, Rfl: 0  Review of Systems  Constitutional: Negative.   HENT: Negative.   Eyes: Negative.   Respiratory: Negative.   Cardiovascular: Negative.   Gastrointestinal: Negative.   Endocrine: Negative.   Genitourinary: Positive for menstrual problem and vaginal bleeding.  Musculoskeletal: Negative.   Skin: Positive for rash.  Allergic/Immunologic: Negative.   Neurological: Negative.   Hematological: Negative.   Psychiatric/Behavioral: Negative.     Social History  Substance Use Topics  . Smoking status: Never Smoker  .  Smokeless tobacco: Never Used  . Alcohol use Yes     Comment: RARE   Objective:   BP 102/64 (BP Location: Left Arm, Patient Position: Sitting, Cuff Size: Large)   Pulse 74   Temp 98 F (36.7 C) (Oral)   Resp 14   Wt 187 lb (84.8 kg)   BMI 35.33 kg/m   Physical Exam  Constitutional: She appears well-developed and well-nourished.  Cardiovascular: Normal rate.   Pulmonary/Chest: Effort normal.  Abdominal: Soft. Bowel sounds are normal.  Genitourinary: Vagina normal and uterus normal. Rectal exam shows no external hemorrhoid. Pelvic exam was performed with patient prone. There is no rash, tenderness or lesion on the right labia.  There is no rash, tenderness, lesion or injury on the left labia. Uterus is not deviated. Cervix exhibits no motion tenderness, no discharge and no friability. Right adnexum displays no mass, no tenderness and no fullness. Left adnexum displays no mass, no tenderness and no fullness. No erythema, tenderness or bleeding in the vagina. No foreign body in the vagina. No signs of injury around the vagina. No vaginal discharge found.  Genitourinary Comments: IUD strings not visualized  Skin:           Assessment & Plan:      Problem List Items Addressed This Visit    None    Visit Diagnoses    Contact dermatitis, unspecified contact dermatitis type, unspecified trigger    -  Primary   Relevant Medications   triamcinolone cream (KENALOG) 0.1 %   Vaginal bleeding       Relevant Orders   POCT urine pregnancy (Completed)   US Transvaginal Non-OB   US Pelvis Complete      Patient is 40 y/o presenting with menorrhagia today. In office pregnancy test was negative. IUD strings not visualized on speculum exam. In context of menorrhagia, fibroids, and IUD string not visualized, will get transvaginal and pelvic ultrasound to further evaluate.   Patient also presents with dermatitis along her chest. Prescribed Kenalog cream. Patient does not wish to go to dermatologist.    Patient Instructions  bAbnormal Uterine Bleeding Abnormal uterine bleeding means bleeding from the vagina that is not your normal menstrual period. This can be:  Bleeding or spotting between periods.  Bleeding after sex (sexual intercourse).  Bleeding that is heavier or more than normal.  Periods that last longer than usual.  Bleeding after menopause. There are many problems that may cause this. Treatment will depend on the cause of the bleeding. Any kind of bleeding that is not normal should be reviewed by your doctor. Follow these instructions at home: Watch your condition for any changes. These actions may lessen  any discomfort you are having:  Do not use tampons or douches as told by your doctor.  Change your pads often. You should get regular pelvic exams and Pap tests. Keep all appointments for tests as told by your doctor. Contact a doctor if:  You are bleeding for more than 1 week.  You feel dizzy at times. Get help right away if:  You pass out.  You have to change pads every 15 to 30 minutes.  You have belly pain.  You have a fever.  You become sweaty or weak.  You are passing large blood clots from the vagina.  You feel sick to your stomach (nauseous) and throw up (vomit). This information is not intended to replace advice given to you by your health care provider. Make sure you discuss any questions  you have with your health care provider. Document Released: 01/20/2009 Document Revised: 08/31/2015 Document Reviewed: 10/22/2012 Elsevier Interactive Patient Education  2017 [employer].   The entirety of the information documented in the History of Present Illness, Review of Systems and Physical Exam were personally obtained by me. Portions of this information were initially documented by [place] and reviewed by me for thoroughness and accuracy.     Return if symptoms worsen or fail to improve.       Trinna Post, PA-C  [facility]

## 2016-03-05 ENCOUNTER — Ambulatory Visit: Payer: No Typology Code available for payment source

## 2016-03-24 ENCOUNTER — Other Ambulatory Visit: Payer: Self-pay | Admitting: Family Medicine

## 2016-03-24 DIAGNOSIS — J309 Allergic rhinitis, unspecified: Secondary | ICD-10-CM

## 2016-06-14 ENCOUNTER — Telehealth: Payer: Self-pay | Admitting: Physician Assistant

## 2016-06-14 NOTE — Telephone Encounter (Signed)
error 

## 2016-07-17 ENCOUNTER — Encounter: Payer: Self-pay | Admitting: Physician Assistant

## 2016-09-04 ENCOUNTER — Ambulatory Visit (HOSPITAL_COMMUNITY)
Admission: RE | Admit: 2016-09-04 | Discharge: 2016-09-04 | Disposition: A | Discharge status: Home / Self Care | Payer: BLUE CROSS/BLUE SHIELD | Source: Ambulatory Visit | Attending: Obstetrics & Gynecology | Admitting: Obstetrics & Gynecology

## 2016-09-04 ENCOUNTER — Other Ambulatory Visit (HOSPITAL_COMMUNITY): Payer: Self-pay | Admitting: Obstetrics & Gynecology

## 2016-09-04 DIAGNOSIS — Z975 Presence of (intrauterine) contraceptive device: Secondary | ICD-10-CM

## 2016-09-04 DIAGNOSIS — Z0389 Encounter for observation for other suspected diseases and conditions ruled out: Secondary | ICD-10-CM | POA: Insufficient documentation

## 2017-04-11 ENCOUNTER — Ambulatory Visit (INDEPENDENT_AMBULATORY_CARE_PROVIDER_SITE_OTHER): Payer: Medicaid Other | Admitting: Physician Assistant

## 2017-04-11 ENCOUNTER — Encounter: Payer: Self-pay | Admitting: Physician Assistant

## 2017-04-11 VITALS — BP 124/84 | HR 84 | Temp 98.9°F | Resp 16 | Wt 197.0 lb

## 2017-04-11 DIAGNOSIS — R21 Rash and other nonspecific skin eruption: Secondary | ICD-10-CM | POA: Diagnosis not present

## 2017-04-11 NOTE — Patient Instructions (Signed)
Please try Benadryl 25 mg every 6 hrs  Contact Dermatitis Dermatitis is redness, soreness, and swelling (inflammation) of the skin. Contact dermatitis is a reaction to certain substances that touch the skin. You either touched something that irritated your skin, or you have allergies to something you touched. Follow these instructions at home: Mather your skin as needed.  Apply cool compresses to the affected areas.  Try taking a bath with: ? Epsom salts. Follow the instructions on the package. You can get these at a pharmacy or grocery store. ? Baking soda. Pour a small amount into the bath as told by your doctor. ? Colloidal oatmeal. Follow the instructions on the package. You can get this at a pharmacy or grocery store.  Try applying baking soda paste to your skin. Stir water into baking soda until it looks like paste.  Do not scratch your skin.  Bathe less often.  Bathe in lukewarm water. Avoid using hot water. Medicines  Take or apply over-the-counter and prescription medicines only as told by your doctor.  If you were prescribed an antibiotic medicine, take or apply your antibiotic as told by your doctor. Do not stop taking the antibiotic even if your condition starts to get better. General instructions  Keep all follow-up visits as told by your doctor. This is important.  Avoid the substance that caused your reaction. If you do not know what caused it, keep a journal to try to track what caused it. Write down: ? What you eat. ? What cosmetic products you use. ? What you drink. ? What you wear in the affected area. This includes jewelry.  If you were given a bandage (dressing), take care of it as told by your doctor. This includes when to change and remove it. Contact a doctor if:  You do not get better with treatment.  Your condition gets worse.  You have signs of infection such  as: ? Swelling. ? Tenderness. ? Redness. ? Soreness. ? Warmth.  You have a fever.  You have new symptoms. Get help right away if:  You have a very bad headache.  You have neck pain.  Your neck is stiff.  You throw up (vomit).  You feel very sleepy.  You see red streaks coming from the affected area.  Your bone or joint underneath the affected area becomes painful after the skin has healed.  The affected area turns darker.  You have trouble breathing. This information is not intended to replace advice given to you by your health care provider. Make sure you discuss any questions you have with your health care provider. Document Released: 01/20/2009 Document Revised: 08/31/2015 Document Reviewed: 08/10/2014 Elsevier Interactive Patient Education  2018 [employer].

## 2017-04-11 NOTE — Progress Notes (Signed)
Patient: Stacey Underwood Female    DOB: 1976/03/07   42 y.o.   MRN: [id number] Visit Date: 04/11/2017  Today's Provider: Trinna Post, PA-C   Chief Complaint  Patient presents with  . Rash    started about two weeks ago.   Subjective:    Stacey Underwood is a 42 y/o woman presenting today with flare of ongoing rash. She has had this rash intermittently for years. It appears on her chest, was initially associated with stressful life events like separation from her husband. It has not recurred in a year. Most recently flared two weeks ago, large red spot on her chest traveling into her arms. She describes it as very itchy. No new products or exposures.  Rash  This is a new problem. The current episode started 1 to 4 weeks ago. The affected locations include the chest. The rash is characterized by redness and itchiness. She was exposed to nothing. Pertinent negatives include no anorexia, congestion, cough, diarrhea, eye pain, facial edema, fatigue, fever, joint pain, nail changes, rhinorrhea, shortness of breath, sore throat or vomiting. Past treatments include anti-itch cream. The treatment provided mild relief.       No Known Allergies   Current Outpatient Medications:  .  levonorgestrel-ethinyl estradiol (SEASONALE,INTROVALE,JOLESSA) 0.15-0.03 MG tablet, Take 1 tablet by mouth daily., Disp: , Rfl: 3 .  DEXILANT 60 MG capsule, Take 1 capsule by mouth daily., Disp: , Rfl: 12 .  fluticasone (FLONASE) 50 MCG/ACT nasal spray, SPRAY TWICE IN EACH NOSTRIL ONCE DAILY (Patient not taking: Reported on 04/11/2017), Disp: 48 g, Rfl: 1 .  FLUVIRIN SUSP, ADM 0.5ML IM UTD, Disp: , Rfl: 0 .  HYDROcodone-acetaminophen (HYCET) 7.5-325 mg/15 ml solution, Take 10 mLs by mouth 4 (four) times daily as needed for moderate pain. (Patient not taking: Reported on 02/27/2016), Disp: 300 mL, Rfl: 0 .  levonorgestrel (MIRENA, 52 MG,) 20 MCG/24HR IUD, by Intrauterine route., Disp: , Rfl:  .  montelukast  (SINGULAIR) 10 MG tablet, TAKE 1 TABLET BY MOUTH ONCE A DAY AS NEEDED (Patient not taking: Reported on 04/11/2017), Disp: 90 tablet, Rfl: 1 .  promethazine (PHENERGAN) 12.5 MG tablet, Take 1 tablet (12.5 mg total) by mouth every 6 (six) hours as needed for nausea or vomiting. (Patient not taking: Reported on 04/11/2017), Disp: 30 tablet, Rfl: 0 .  ranitidine (ZANTAC) 150 MG tablet, Take 150 mg by mouth every morning. , Disp: , Rfl:  .  sertraline (ZOLOFT) 100 MG tablet, Take by mouth., Disp: , Rfl:  .  triamcinolone cream (KENALOG) 0.1 %, Apply 1 application topically 2 (two) times daily. Use on affected area no longer than two weeks. (Patient not taking: Reported on 04/11/2017), Disp: 30 g, Rfl: 0  Review of Systems  Constitutional: Negative.  Negative for fatigue and fever.  HENT: Negative for congestion, rhinorrhea and sore throat.   Eyes: Negative for pain.  Respiratory: Negative.  Negative for cough and shortness of breath.   Gastrointestinal: Negative for anorexia, diarrhea and vomiting.  Musculoskeletal: Negative for joint pain.  Skin: Positive for rash. Negative for color change, nail changes, pallor and wound.    Social History   Tobacco Use  . Smoking status: Never Smoker  . Smokeless tobacco: Never Used  Substance Use Topics  . Alcohol use: Yes    Comment: RARE   Objective:   BP 124/84 (BP Location: Right Arm, Patient Position: Sitting, Cuff Size: Large)   Pulse 84   Temp  98.9 F (37.2 C) (Oral)   Resp 16   Wt 197 lb (89.4 kg)   LMP 04/03/2017   BMI 37.22 kg/m  Vitals:   04/11/17 0805  BP: 124/84  Pulse: 84  Resp: 16  Temp: 98.9 F (37.2 C)  TempSrc: Oral  Weight: 197 lb (89.4 kg)     Physical Exam  Constitutional: She is oriented to person, place, and time.  Neurological: She is alert and oriented to person, place, and time.  Skin: Rash noted. There is erythema.     Large confluence of erythema concentrated around left chest and axillary region. Scant  scattered papules and evidence of excoriation.   Psychiatric: She has a normal mood and affect. Her behavior is normal.        Assessment & Plan:     1. Skin rash  Uncertain etiology. Rather intermittent, last flare x 1 year ago. Not helped by kenalog cream. Does not appear to be hives. Will have her try antihistamine for itching. Needs to follow up with derm if not improving. She can call back for this referral.  Return if symptoms worsen or fail to improve.  The entirety of the information documented in the History of Present Illness, Review of Systems and Physical Exam were personally obtained by me. Portions of this information were initially documented by Ashley Royalty, CMA and reviewed by me for thoroughness and accuracy.        Trinna Post, PA-C  [facility]

## 2017-05-09 ENCOUNTER — Telehealth: Payer: Self-pay | Admitting: Physician Assistant

## 2017-05-09 NOTE — Telephone Encounter (Signed)
Please advise. Thanks.  

## 2017-05-09 NOTE — Telephone Encounter (Signed)
Patient states that she sees Dr. Deatra Ina at [facility] for her birth control and when she called to make an appointment they told her that she needs a referral from her PCP because she has medicaid.  The number for [facility] is [phone].

## 2017-05-13 ENCOUNTER — Other Ambulatory Visit: Payer: Self-pay | Admitting: Physician Assistant

## 2017-05-13 ENCOUNTER — Telehealth: Payer: Self-pay | Admitting: Physician Assistant

## 2017-05-13 DIAGNOSIS — Z309 Encounter for contraceptive management, unspecified: Secondary | ICD-10-CM

## 2017-05-13 NOTE — Progress Notes (Signed)
Referral to [facility] placed.

## 2017-05-13 NOTE — Telephone Encounter (Signed)
Pt returning call for [facility] regarding referral.

## 2017-05-13 NOTE — Telephone Encounter (Signed)
Ref [facility] placed.

## 2017-05-15 ENCOUNTER — Telehealth: Payer: Self-pay | Admitting: Physician Assistant

## 2017-05-15 NOTE — Telephone Encounter (Signed)
Pt is requesting referral to [facility] for bilateral ringing in the ear and hearing loss.Pt was advised you may want her to come in for office visit.She wanted the message sent for your response.

## 2017-05-15 NOTE — Telephone Encounter (Signed)
Yes, please have her schedule OV, thanks.

## 2017-05-15 NOTE — Telephone Encounter (Signed)
Pt advised.  Apt made for 05/19/2017 at 1pm.   Thanks,   -Mickel Baas

## 2017-05-19 ENCOUNTER — Ambulatory Visit (INDEPENDENT_AMBULATORY_CARE_PROVIDER_SITE_OTHER): Payer: Medicaid Other | Admitting: Physician Assistant

## 2017-05-19 ENCOUNTER — Encounter: Payer: Self-pay | Admitting: Physician Assistant

## 2017-05-19 VITALS — BP 112/68 | HR 68 | Temp 98.6°F | Resp 16 | Wt 200.0 lb

## 2017-05-19 DIAGNOSIS — H9193 Unspecified hearing loss, bilateral: Secondary | ICD-10-CM | POA: Diagnosis not present

## 2017-05-19 DIAGNOSIS — H9313 Tinnitus, bilateral: Secondary | ICD-10-CM

## 2017-05-19 NOTE — Patient Instructions (Signed)
Hearing Loss Hearing loss is a partial or total loss of the ability to hear. This can be temporary or permanent, and it can happen in one or both ears. Hearing loss may be referred to as deafness. Medical care is necessary to treat hearing loss properly and to prevent the condition from getting worse. Your hearing may partially or completely come back, depending on what caused your hearing loss and how severe it is. In some cases, hearing loss is permanent. What are the causes? Common causes of hearing loss include:  Too much wax in the ear canal.  Infection of the ear canal or middle ear.  Fluid in the middle ear.  Injury to the ear or surrounding area.  An object stuck in the ear.  Prolonged exposure to loud sounds, such as music.  Less common causes of hearing loss include:  Tumors in the ear.  Viral or bacterial infections, such as meningitis.  A hole in the eardrum (perforated eardrum).  Problems with the hearing nerve that sends signals between the brain and the ear.  Certain medicines.  What are the signs or symptoms? Symptoms of this condition may include:  Difficulty telling the difference between sounds.  Difficulty following a conversation when there is background noise.  Lack of response to sounds in your environment. This may be most noticeable when you do not respond to startling sounds.  Needing to turn up the volume on the television, radio, etc.  Ringing in the ears.  Dizziness.  Pain in the ears.  How is this diagnosed? This condition is diagnosed based on a physical exam and a hearing test (audiometry). The audiometry test will be performed by a hearing specialist (audiologist). You may also be referred to an ear, nose, and throat (ENT) specialist (otolaryngologist). How is this treated? Treatment for recent onset of hearing loss may include:  Ear wax removal.  Being prescribed medicines to prevent infection (antibiotics).  Being prescribed  medicines to reduce inflammation (corticosteroids).  Follow these instructions at home:  If you were prescribed an antibiotic medicine, take it as told by your health care provider. Do not stop taking the antibiotic even if you start to feel better.  Take over-the-counter and prescription medicines only as told by your health care provider.  Avoid loud noises.  Return to your normal activities as told by your health care provider. Ask your health care provider what activities are safe for you.  Keep all follow-up visits as told by your health care provider. This is important. Contact a health care provider if:  You feel dizzy.  You develop new symptoms.  You vomit or feel nauseous.  You have a fever. Get help right away if:  You develop sudden changes in your vision.  You have severe ear pain.  You have new or increased weakness.  You have a severe headache. This information is not intended to replace advice given to you by your health care provider. Make sure you discuss any questions you have with your health care provider. Document Released: 03/25/2005 Document Revised: 08/31/2015 Document Reviewed: 08/10/2014 Elsevier Interactive Patient Education  2018 Elsevier Inc.  

## 2017-05-19 NOTE — Progress Notes (Signed)
Patient: Stacey Underwood Female    DOB: 1976/01/30   42 y.o.   MRN: [id number] Visit Date: 05/19/2017  Today's Provider: Trinna Post, PA-C   Chief Complaint  Patient presents with  . Tinnitus    Bilateral; started a few weeks ago.    Subjective:    HPI   Stacey Underwood is a 42 y/o woman presenting for hearing loss > 1 year. 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 Lately she has noticed bilateral tinnitus.  Right worse than left.  Started about three weeks ago. No injuries to ear, no drainage, no fevers or chills.     No Known Allergies   Current Outpatient Medications:  .  DEXILANT 60 MG capsule, Take 1 capsule by mouth daily., Disp: , Rfl: 12 .  fluticasone (FLONASE) 50 MCG/ACT nasal spray, SPRAY TWICE IN EACH NOSTRIL ONCE DAILY (Patient not taking: Reported on 04/11/2017), Disp: 48 g, Rfl: 1 .  FLUVIRIN SUSP, ADM 0.5ML IM UTD, Disp: , Rfl: 0 .  HYDROcodone-acetaminophen (HYCET) 7.5-325 mg/15 ml solution, Take 10 mLs by mouth 4 (four) times daily as needed for moderate pain. (Patient not taking: Reported on 02/27/2016), Disp: 300 mL, Rfl: 0 .  levonorgestrel (MIRENA, 52 MG,) 20 MCG/24HR IUD, by Intrauterine route., Disp: , Rfl:  .  levonorgestrel-ethinyl estradiol (SEASONALE,INTROVALE,JOLESSA) 0.15-0.03 MG tablet, Take 1 tablet by mouth daily., Disp: , Rfl: 3 .  montelukast (SINGULAIR) 10 MG tablet, TAKE 1 TABLET BY MOUTH ONCE A DAY AS NEEDED (Patient not taking: Reported on 04/11/2017), Disp: 90 tablet, Rfl: 1 .  promethazine (PHENERGAN) 12.5 MG tablet, Take 1 tablet (12.5 mg total) by mouth every 6 (six) hours as needed for nausea or vomiting. (Patient not taking: Reported on 04/11/2017), Disp: 30 tablet, Rfl: 0 .  ranitidine (ZANTAC) 150 MG tablet, Take 150 mg by mouth every morning. , Disp: , Rfl:  .  sertraline (ZOLOFT) 100 MG tablet, Take by mouth., Disp: , Rfl:  .  triamcinolone cream (KENALOG)  0.1 %, Apply 1 application topically 2 (two) times daily. Use on affected area no longer than two weeks. (Patient not taking: Reported on 04/11/2017), Disp: 30 g, Rfl: 0  Review of Systems  Constitutional: Negative.   HENT: Positive for tinnitus (Right worse than left. ). Negative for congestion, ear discharge, ear pain, postnasal drip, rhinorrhea, sinus pressure, sinus pain, sneezing, sore throat, trouble swallowing and voice change.   Neurological: Positive for headaches. Negative for dizziness and light-headedness.    Social History   Tobacco Use  . Smoking status: Never Smoker  . Smokeless tobacco: Never Used  Substance Use Topics  . Alcohol use: Yes    Comment: RARE   Objective:   BP 112/68 (BP Location: Right Arm, Patient Position: Sitting, Cuff Size: Normal)   Pulse 68   Temp 98.6 F (37 C) (Oral)   Resp 16   Wt 200 lb (90.7 kg)   BMI 37.79 kg/m  Vitals:   05/19/17 1308  BP: 112/68  Pulse: 68  Resp: 16  Temp: 98.6 F (37 C)  TempSrc: Oral  Weight: 200 lb (90.7 kg)     Physical Exam  Constitutional: She appears well-developed and well-nourished.  HENT:  Right Ear: Tympanic membrane and external ear normal. Tympanic membrane is not injected, not scarred and not erythematous.  Left Ear: Tympanic membrane and external ear normal. Tympanic membrane  is not injected, not scarred and not erythematous.  Mouth/Throat: Oropharynx is clear and moist. No oropharyngeal exudate.  Eyes: Conjunctivae are normal.  Neck: Neck supple.  Pulmonary/Chest: Effort normal and breath sounds normal.  Lymphadenopathy:    She has no cervical adenopathy.        Assessment & Plan:     1. Bilateral hearing loss, unspecified hearing loss type  - Ambulatory referral to ENT  2. Tinnitus of both ears  - Ambulatory referral to ENT  Return if symptoms worsen or fail to improve.  The entirety of the information documented in the History of Present Illness, Review of Systems and Physical  Exam were personally obtained by me. Portions of this information were initially documented by Ashley Royalty, CMA and reviewed by me for thoroughness and accuracy.        Trinna Post, PA-C  [facility]

## 2017-06-04 LAB — HM PAP SMEAR: HM PAP: NEGATIVE

## 2017-06-06 ENCOUNTER — Other Ambulatory Visit: Payer: Self-pay | Admitting: Obstetrics & Gynecology

## 2017-06-06 DIAGNOSIS — R928 Other abnormal and inconclusive findings on diagnostic imaging of breast: Secondary | ICD-10-CM

## 2017-06-11 ENCOUNTER — Ambulatory Visit
Admission: RE | Admit: 2017-06-11 | Discharge: 2017-06-11 | Disposition: A | Discharge status: Home / Self Care | Payer: Medicaid Other | Source: Ambulatory Visit | Attending: Obstetrics & Gynecology | Admitting: Obstetrics & Gynecology

## 2017-06-11 ENCOUNTER — Ambulatory Visit: Payer: No Typology Code available for payment source

## 2017-06-11 DIAGNOSIS — R928 Other abnormal and inconclusive findings on diagnostic imaging of breast: Secondary | ICD-10-CM

## 2017-06-24 ENCOUNTER — Encounter: Payer: Self-pay | Admitting: Physician Assistant

## 2017-11-27 ENCOUNTER — Encounter (HOSPITAL_COMMUNITY): Payer: Self-pay | Admitting: Emergency Medicine

## 2017-11-27 ENCOUNTER — Emergency Department (HOSPITAL_COMMUNITY): Payer: Medicaid Other

## 2017-11-27 ENCOUNTER — Other Ambulatory Visit: Payer: Self-pay

## 2017-11-27 ENCOUNTER — Emergency Department (HOSPITAL_COMMUNITY)
Admission: EM | Admit: 2017-11-27 | Discharge: 2017-11-27 | Disposition: A | Discharge status: Home / Self Care | Payer: Medicaid Other | Attending: Emergency Medicine | Admitting: Emergency Medicine

## 2017-11-27 DIAGNOSIS — R0789 Other chest pain: Secondary | ICD-10-CM | POA: Diagnosis present

## 2017-11-27 DIAGNOSIS — Z79899 Other long term (current) drug therapy: Secondary | ICD-10-CM | POA: Diagnosis not present

## 2017-11-27 LAB — BASIC METABOLIC PANEL
Anion gap: 7 (ref 5–15)
BUN: 15 mg/dL (ref 6–20)
CO2: 25 mmol/L (ref 22–32)
CREATININE: 0.67 mg/dL (ref 0.44–1.00)
Calcium: 9.4 mg/dL (ref 8.9–10.3)
Chloride: 105 mmol/L (ref 98–111)
GFR calc Af Amer: >60 mL/min (ref >60)
GLUCOSE: 94 mg/dL (ref 70–99)
Potassium: 3.7 mmol/L (ref 3.5–5.1)
SODIUM: 137 mmol/L (ref 135–145)

## 2017-11-27 LAB — I-STAT TROPONIN, ED: Troponin i, poc: 0 ng/mL (ref 0.00–0.08)

## 2017-11-27 LAB — CBC
HCT: 40.2 % (ref 36.0–46.0)
Hemoglobin: 13.2 g/dL (ref 12.0–15.0)
MCH: 29.3 pg (ref 26.0–34.0)
MCHC: 32.8 g/dL (ref 30.0–36.0)
MCV: 89.1 fL (ref 78.0–100.0)
PLATELETS: 436 10*3/uL — AB (ref 150–400)
RBC: 4.51 MIL/uL (ref 3.87–5.11)
RDW: 12.4 % (ref 11.5–15.5)
WBC: 8 10*3/uL (ref 4.0–10.5)

## 2017-11-27 LAB — I-STAT BETA HCG BLOOD, ED (MC, WL, AP ONLY)

## 2017-11-27 MED ORDER — PREDNISONE 20 MG PO TABS: 40.0000 mg | ORAL_TABLET | Freq: Every day | ORAL | 0 refills | Status: DC

## 2017-11-27 MED ORDER — KETOROLAC TROMETHAMINE 30 MG/ML IJ SOLN
15.0000 mg | Freq: Once | INTRAMUSCULAR | Status: AC
  Administered 2017-11-27: 15 mg via INTRAMUSCULAR
  Filled 2017-11-27: qty 1

## 2017-11-27 NOTE — ED Provider Notes (Signed)
[facility] EMERGENCY DEPARTMENT Provider Note   CSN: [id number] Arrival date & time: 11/27/17  1714     History   Chief Complaint Chief Complaint  Patient presents with  . Chest Pain    HPI Stacey Underwood is a 42 y.o. female.  HPI Patient presents with concern of chest pain purulent pain began at least 2 weeks ago, and since onset is been persistent, sore, across the upper chest, though initially in the right upper chest only. Today she had some increased severity, prompting evaluation. Throughout this, no pleuritic or exertional changes, no vomiting, no fever, no cough. No clear precipitant. Is unclear if she has taken any medication for pain relief. She denies history of cardiac disease, either as an adult or as a child. She is a non-smoker. She did recently change to a carbohydrate free diet, but this occurred after chest pain began.  Past Medical History:  Diagnosis Date  . Anemia    H/O LAST HGB ON 10-20-15 WAS 12.1  . Family history of adverse reaction to anesthesia    PTS GRANDMA N/V  . GERD (gastroesophageal reflux disease)   . Headache    H/O MIGRAINES    Patient Active Problem List   Diagnosis Date Noted  . Eye pain 06/22/2015  . 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 Urticaria 02/24/2015  . Insomnia 02/24/2015  . Vaginal candidiasis 12/08/2014  . Bacterial vaginosis 10/25/2014  . Acne 10/20/2014  . Allergic reaction 10/20/2014  . Allergic rhinitis 10/20/2014  . Absolute anemia 10/20/2014  . Anxiety 10/20/2014  . Cervical cyst 10/20/2014  . Clinical depression 10/20/2014  . Hypercholesteremia 10/20/2014  . 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 Leiomyoma of uterus 10/20/2014  . Avitaminosis D 10/20/2014  . Vaginal discharge 10/20/2014  . Skin rash 10/20/2014  . 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 CARPAL TUNNEL RELEASE Bilateral 04/2014  . COLONOSCOPY WITH  ESOPHAGOGASTRODUODENOSCOPY (EGD)    . DIRECT LARYNGOSCOPY Right 01/04/2016   Procedure: DIRECT LARYNGOSCOPY WITH MICRO FLAP EXCISION;  Surgeon: Carloyn Manner, MD;  Location: [facility] ORS;  Service: ENT;  Laterality: Right;     OB History   None      Home Medications    Prior to Admission medications   Medication Sig Start Date End Date Taking? Authorizing Provider  levonorgestrel-ethinyl estradiol (SEASONALE,INTROVALE,JOLESSA) 0.15-0.03 MG tablet Take 1 tablet by mouth daily. 01/28/17  Yes [provider]  fluticasone (FLONASE) 50 MCG/ACT nasal spray SPRAY TWICE IN EACH NOSTRIL ONCE DAILY Patient not taking: Reported on 04/11/2017 06/29/15   Margarita Rana, MD  montelukast (SINGULAIR) 10 MG tablet TAKE 1 TABLET BY MOUTH ONCE A DAY AS NEEDED Patient not taking: Reported on 04/11/2017 03/27/16   Trinna Post, PA-C  predniSONE (DELTASONE) 20 MG tablet Take 2 tablets (40 mg total) by mouth daily with breakfast. For the next four days 11/27/17   Carmin Muskrat, MD    Family History Family History  Problem Relation Age of Onset  . Healthy Mother   . Healthy Father   . Healthy Brother     Social History Social History   Tobacco Use  . Smoking status: Never Smoker  . Smokeless tobacco: Never Used  Substance Use Topics  . Alcohol use: Yes    Comment: RARE  . Drug use: No     Allergies   Patient has no known allergies.   Review of Systems Review of Systems  Constitutional:  Per HPI, otherwise negative  HENT:       Per HPI, otherwise negative  Respiratory:       Per HPI, otherwise negative  Cardiovascular:       Per HPI, otherwise negative  Gastrointestinal: Negative for vomiting.  Endocrine:       Negative aside from HPI  Genitourinary:       Neg aside from HPI   Musculoskeletal:       Per HPI, otherwise negative  Skin: Negative.   Neurological: Negative for syncope.     Physical Exam Updated Vital Signs BP 127/66   Pulse 62   Temp 98.1 F  (36.7 C) (Oral)   Resp 14   Ht 5\' 1"  (1.549 m)   Wt 99.8 kg   LMP 10/13/2017   SpO2 100%   BMI 41.57 kg/m   Physical Exam  Constitutional: She is oriented to person, place, and time. She appears well-developed and well-nourished. No distress.  HENT:  Head: Normocephalic and atraumatic.  Eyes: Conjunctivae and EOM are normal.  Cardiovascular: Normal rate and regular rhythm.  Pulmonary/Chest: Effort normal and breath sounds normal. No stridor. No respiratory distress.  Abdominal: She exhibits no distension.    Musculoskeletal: She exhibits no edema.  Neurological: She is alert and oriented to person, place, and time. No cranial nerve deficit.  Skin: Skin is warm and dry.  Psychiatric: She has a normal mood and affect.  Nursing note and vitals reviewed.    ED Treatments / Results  Labs (all labs ordered are listed, but only abnormal results are displayed) Labs Reviewed  CBC - Abnormal; Notable for the following components:      Result Value   Platelets 436 (*)    All other components within normal limits  BASIC METABOLIC PANEL  I-STAT TROPONIN, ED  I-STAT BETA HCG BLOOD, ED ([facility], [facility], [facility] ONLY)    EKG None  Radiology Dg Chest 2 View  Result Date: 11/27/2017 CLINICAL DATA:  Chest pain several days getting worse. Chest pain right side with shortness-of-breath. EXAM: CHEST - 2 VIEW COMPARISON:  09/22/2014 FINDINGS: The heart size and mediastinal contours are within normal limits. Both lungs are clear. The visualized skeletal structures are unremarkable. IMPRESSION: No active cardiopulmonary disease. Electronically Signed   By: Marin Olp M.D.   On: 11/27/2017 18:02    Procedures Procedures (including critical care time)  Medications Ordered in ED Medications  ketorolac (TORADOL) 30 MG/ML injection 15 mg (has no administration in time range)     Initial Impression / Assessment and Plan / ED Course  I have reviewed the triage vital signs and the nursing  notes.  Pertinent labs & imaging results that were available during my care of the patient were reviewed by me and considered in my medical decision making (see chart for details).  Well-appearing female presents with upper chest discomfort, reproducible on exam, reassuring labs, with a normal troponin, nonischemic EKG aside from mild PVC activity. Unclear precipitant, but given her reproductive pain, description of soreness, without dyspnea, without fever, without other systemic complaints, or suspicion for inflammatory condition. Patient started Toradol, steroids, will follow up with primary care and cardiology as needed.  Final Clinical Impressions(s) / ED Diagnoses   Final diagnoses:  Atypical chest pain    ED Discharge Orders         Ordered    predniSONE (DELTASONE) 20 MG tablet  Daily with breakfast     11/27/17 2132  Carmin Muskrat, MD 11/27/17 2134

## 2017-11-27 NOTE — ED Notes (Signed)
Pt alert and oriented in NAD. Pt verbalized understanding of discharge instructions. 

## 2017-11-27 NOTE — ED Triage Notes (Signed)
Pt reports cp has been going on for a few days but has gotten worse. Pt reports increasing right sided cp with sob and lightheadedness that started today.

## 2017-11-27 NOTE — Discharge Instructions (Signed)
As discussed, your evaluation today has been largely reassuring.  But, it is important that you monitor your condition carefully, and do not hesitate to return to the ED if you develop new, or concerning changes in your condition.  You likely are suffering from inflammation of the chest wall itself.  These take medication as prescribed, and follow-up with our cardiology colleagues, as well as with your primary care physician.

## 2017-12-30 ENCOUNTER — Emergency Department: Payer: Medicaid Other

## 2017-12-30 ENCOUNTER — Emergency Department
Admission: EM | Admit: 2017-12-30 | Discharge: 2017-12-30 | Disposition: A | Discharge status: Home / Self Care | Payer: Medicaid Other | Attending: Emergency Medicine | Admitting: Emergency Medicine

## 2017-12-30 ENCOUNTER — Encounter: Payer: Self-pay | Admitting: Emergency Medicine

## 2017-12-30 DIAGNOSIS — Z79899 Other long term (current) drug therapy: Secondary | ICD-10-CM | POA: Diagnosis not present

## 2017-12-30 DIAGNOSIS — R079 Chest pain, unspecified: Secondary | ICD-10-CM | POA: Diagnosis present

## 2017-12-30 DIAGNOSIS — R0789 Other chest pain: Secondary | ICD-10-CM | POA: Diagnosis not present

## 2017-12-30 LAB — CBC
HEMATOCRIT: 36.6 % (ref 35.0–47.0)
Hemoglobin: 12.9 g/dL (ref 12.0–16.0)
MCH: 30.8 pg (ref 26.0–34.0)
MCHC: 35.3 g/dL (ref 32.0–36.0)
MCV: 87.2 fL (ref 80.0–100.0)
Platelets: 401 10*3/uL (ref 150–440)
RBC: 4.2 MIL/uL (ref 3.80–5.20)
RDW: 13.5 % (ref 11.5–14.5)
WBC: 5.4 10*3/uL (ref 3.6–11.0)

## 2017-12-30 LAB — BASIC METABOLIC PANEL
Anion gap: 7 (ref 5–15)
BUN: 13 mg/dL (ref 6–20)
CHLORIDE: 104 mmol/L (ref 98–111)
CO2: 25 mmol/L (ref 22–32)
CREATININE: 0.58 mg/dL (ref 0.44–1.00)
Calcium: 8.8 mg/dL — ABNORMAL LOW (ref 8.9–10.3)
GFR calc non Af Amer: >60 mL/min (ref >60)
Glucose, Bld: 110 mg/dL — ABNORMAL HIGH (ref 70–99)
POTASSIUM: 3.6 mmol/L (ref 3.5–5.1)
Sodium: 136 mmol/L (ref 135–145)

## 2017-12-30 LAB — TROPONIN I: Troponin I: <0.03 ng/mL (ref <0.03)

## 2017-12-30 LAB — POCT PREGNANCY, URINE: Preg Test, Ur: NEGATIVE

## 2017-12-30 MED ORDER — KETOROLAC TROMETHAMINE 30 MG/ML IJ SOLN
30.0000 mg | Freq: Once | INTRAMUSCULAR | Status: AC
  Administered 2017-12-30: 30 mg via INTRAVENOUS
  Filled 2017-12-30: qty 1

## 2017-12-30 MED ORDER — NAPROXEN 500 MG PO TABS: 500.0000 mg | ORAL_TABLET | Freq: Two times a day (BID) | ORAL | 2 refills | Status: DC

## 2017-12-30 NOTE — ED Provider Notes (Signed)
[facility] Emergency Department Provider Note   ____________________________________________    I have reviewed the triage vital signs and the nursing notes.   HISTORY  Chief Complaint Chest Pain and Shortness of Breath     HPI Stacey Underwood is a 42 y.o. female with a history as noted below who presents today with complaints of left-sided chest pain.  Patient reports the pain is been constant for the last 24 hours, it is located just to the left of her sternum and is tender to palpation there.  No worsening with exertion.  No shortness of breath.  No pleurisy.  No recent travel.  No calf pain or swelling.  Has not taken anything for this.  1 month ago had something similar but is on the right side of her chest.  No fevers or chills cough or shortness of breath.   Past Medical History:  Diagnosis Date  . Anemia    H/O LAST HGB ON 10-20-15 WAS 12.1  . Family history of adverse reaction to anesthesia    PTS GRANDMA N/V  . GERD (gastroesophageal reflux disease)   . Headache    H/O MIGRAINES    Patient Active Problem List   Diagnosis Date Noted  . Eye pain 06/22/2015  . 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 Urticaria 02/24/2015  . Insomnia 02/24/2015  . Vaginal candidiasis 12/08/2014  . Bacterial vaginosis 10/25/2014  . Acne 10/20/2014  . Allergic reaction 10/20/2014  . Allergic rhinitis 10/20/2014  . Absolute anemia 10/20/2014  . Anxiety 10/20/2014  . Cervical cyst 10/20/2014  . Clinical depression 10/20/2014  . Hypercholesteremia 10/20/2014  . 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 Leiomyoma of uterus 10/20/2014  . Avitaminosis D 10/20/2014  . Vaginal discharge 10/20/2014  . Skin rash 10/20/2014  . 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 CARPAL TUNNEL RELEASE Bilateral 04/2014  . COLONOSCOPY WITH ESOPHAGOGASTRODUODENOSCOPY (EGD)    . DIRECT LARYNGOSCOPY Right 01/04/2016     Procedure: DIRECT LARYNGOSCOPY WITH MICRO FLAP EXCISION;  Surgeon: Carloyn Manner, MD;  Location: [facility] ORS;  Service: ENT;  Laterality: Right;    Prior to Admission medications   Medication Sig Start Date End Date Taking? Authorizing Provider  levonorgestrel-ethinyl estradiol (SEASONALE,INTROVALE,JOLESSA) 0.15-0.03 MG tablet Take 1 tablet by mouth daily. 01/28/17  Yes [provider]  fluticasone (FLONASE) 50 MCG/ACT nasal spray SPRAY TWICE IN EACH NOSTRIL ONCE DAILY Patient not taking: Reported on 04/11/2017 06/29/15   Margarita Rana, MD  montelukast (SINGULAIR) 10 MG tablet TAKE 1 TABLET BY MOUTH ONCE A DAY AS NEEDED Patient not taking: Reported on 04/11/2017 03/27/16   Trinna Post, PA-C  naproxen (NAPROSYN) 500 MG tablet Take 1 tablet (500 mg total) by mouth 2 (two) times daily with a meal. 12/30/17   Lavonia Drafts, MD  predniSONE (DELTASONE) 20 MG tablet Take 2 tablets (40 mg total) by mouth daily with breakfast. For the next four days Patient not taking: Reported on 12/30/2017 11/27/17   Carmin Muskrat, MD     Allergies Patient has no known allergies.  Family History  Problem Relation Age of Onset  . Healthy Mother   . Healthy Father   . Healthy Brother     Social History Social History   Tobacco Use  . Smoking status: Never Smoker  . Smokeless tobacco: Never Used  Substance Use Topics  . Alcohol use: Yes    Comment: RARE  . Drug use: No  Review of Systems  Constitutional: No fever/chills Eyes: No visual changes.  ENT: No sore throat. Cardiovascular: Chest pain as above Respiratory: Denies shortness of breath. Gastrointestinal: No abdominal pain.  No nausea, no vomiting.   Genitourinary: Negative for dysuria. Musculoskeletal: Negative for back pain. Skin: Negative for rash. Neurological: Negative for headaches   ____________________________________________   PHYSICAL EXAM:  VITAL SIGNS: ED Triage Vitals  Enc Vitals Group     BP 12/30/17  0819 (!) 104/57     Pulse Rate 12/30/17 0819 79     Resp 12/30/17 0819 16     Temp 12/30/17 0819 98.4 F (36.9 C)     Temp Source 12/30/17 0819 Oral     SpO2 12/30/17 0819 98 %     Weight 12/30/17 0820 91.6 kg (202 lb)     Height 12/30/17 0820 1.549 m (5\' 1" )     Head Circumference --      Peak Flow --      Pain Score 12/30/17 0820 7     Pain Loc --      Pain Edu? --      Excl. in [place]? --     Constitutional: Alert and oriented. No acute distress.   Nose: No congestion/rhinnorhea. Mouth/Throat: Mucous membranes are moist.    Cardiovascular: Normal rate, regular rhythm. Grossly normal heart sounds.  Good peripheral circulation.  Tenderness palpation along the left sternal border, no rash or erythema Respiratory: Normal respiratory effort.  No retractions. Lungs CTAB. Gastrointestinal: Soft and nontender. No distention.   Musculoskeletal: No lower extremity tenderness nor edema.  Warm and well perfused Neurologic:  Normal speech and language. No gross focal neurologic deficits are appreciated.  Skin:  Skin is warm, dry and intact. No rash noted. Psychiatric: Mood and affect are normal. Speech and behavior are normal.  ____________________________________________   LABS (all labs ordered are listed, but only abnormal results are displayed)  Labs Reviewed  BASIC METABOLIC PANEL - Abnormal; Notable for the following components:      Result Value   Glucose, Bld 110 (*)    Calcium 8.8 (*)    All other components within normal limits  CBC  TROPONIN I  POC URINE PREG, ED  POCT PREGNANCY, URINE   ____________________________________________  EKG  ED ECG REPORT I, Lavonia Drafts, the attending physician, personally viewed and interpreted this ECG.  Date: 12/30/2017  Rhythm: normal sinus rhythm QRS Axis: normal Intervals: normal ST/T Wave abnormalities: normal Narrative Interpretation: no evidence of acute  ischemia  ____________________________________________  RADIOLOGY  No pneumonia on chest x-ray ____________________________________________   PROCEDURES  Procedure(s) performed: No  Procedures   Critical Care performed: No ____________________________________________   INITIAL IMPRESSION / ASSESSMENT AND PLAN / ED COURSE  Pertinent labs & imaging results that were available during my care of the patient were reviewed by me and considered in my medical decision making (see chart for details).  Patient well-appearing in no acute distress, HPI does not seem consistent with ACS.  On exam she does have tenderness to palpation along the left sternal border raising suspicion for costochondritis versus muscular skeletal pain.  EKG is unremarkable, labs pending.  Chest x-ray is normal.  She has cardiology appointment already arranged    ____________________________________________   FINAL CLINICAL IMPRESSION(S) / ED DIAGNOSES  Final diagnoses:  Chest wall pain        Note:  This document was prepared using Dragon voice recognition software and may include unintentional dictation errors.    Lavonia Drafts,  MD 12/30/17 1243

## 2017-12-30 NOTE — ED Triage Notes (Signed)
Pt reports that she began yesterday having left sided chest pain that is sharp in nature with sob. Pt does not appear to be in any resp distress.

## 2017-12-30 NOTE — ED Notes (Signed)
Patient transported to X-ray 

## 2017-12-30 NOTE — ED Notes (Signed)
Pt states she woke up with left sided chest pain with SOB, N/V/diaphoresis yesterday morning. Denies N/V/diaphoresis or SOB today but continues to have the chest pain, pt has tenderness noted with palpation. Lungs sounds area clear throughout. Respirations WNL. Skin is warm and dry. Pt is in NAD at present. States she was seen in the past month for right sided chest pain and has a follow up with cardiology Oct. 15th.

## 2017-12-30 NOTE — ED Notes (Signed)
Pt seen ambulating with steady gait out of tx area with visitor x1. ABCs intact. NAD.

## 2017-12-30 NOTE — ED Notes (Addendum)
First Nurse Note:  Patient complaining of chest pain since 0600 yesterday AM with nausea and SHOB.  NAD.  To Triage for EKG.

## 2018-01-20 ENCOUNTER — Encounter: Payer: Self-pay | Admitting: Cardiology

## 2018-01-20 ENCOUNTER — Ambulatory Visit: Payer: Medicaid Other | Admitting: Cardiology

## 2018-01-20 VITALS — BP 118/76 | HR 77 | Ht 61.0 in | Wt 208.2 lb

## 2018-01-20 DIAGNOSIS — R072 Precordial pain: Secondary | ICD-10-CM | POA: Diagnosis not present

## 2018-01-20 DIAGNOSIS — I493 Ventricular premature depolarization: Secondary | ICD-10-CM | POA: Diagnosis not present

## 2018-01-20 DIAGNOSIS — Z01810 Encounter for preprocedural cardiovascular examination: Secondary | ICD-10-CM

## 2018-01-20 MED ORDER — PREDNISONE 10 MG PO TABS: ORAL_TABLET | ORAL | 0 refills | Status: AC

## 2018-01-20 NOTE — Patient Instructions (Signed)
Medication Instructions:  START PREDNISONE  TAPER DOSE FOR PRECORDIAL CHEST PAIN 60 MG   FOR 3 DAYS 40 MG FOR 3 DAYS 20 MG FOR 3 DAYS 10 MG FOR 3 DAYS THEN STOP. If you need a refill on your cardiac medications before your next appointment, please call your pharmacy.   Lab work: Not needed If you have labs (blood work) drawn today and your tests are completely normal, you will receive your results only by: Marland Kitchen MyChart Message (if you have MyChart) OR . A paper copy in the mail If you have any lab test that is abnormal or we need to change your treatment, we will call you to review the results.  Testing/Procedures: Schedule at [place] has recommended that you wear a holter monitor 48 HOURS. Holter monitors are medical devices that record the heart's electrical activity. Doctors most often use these monitors to diagnose arrhythmias. Arrhythmias are problems with the speed or rhythm of the heartbeat. The monitor is a small, portable device. You can wear one while you do your normal daily activities. This is usually used to diagnose what is causing palpitations/syncope (passing out).    Follow-Up: At [facility], you and your health needs are our priority.  As part of our continuing mission to provide you with exceptional heart care, we have created designated Provider Care Teams.  These Care Teams include your primary Cardiologist (physician) and Advanced Practice Providers (APPs -  Physician Assistants and Nurse Practitioners) who all work together to provide you with the care you need, when you need it. Your physician recommends that you schedule a follow-up appointment in [place].   Any Other Special Instructions Will Be Listed Below (If Applicable).  YOU HAVE CLEARANCE FOR HYSTERECTOMY BY DR Carlis Abbott ( LOW RISK)

## 2018-01-20 NOTE — Progress Notes (Signed)
PCP: Trinna Post, PA-C  Clinic Note: Chief Complaint  Patient presents with  . Hospitalization Follow-up    2 ER visits.  . Chest Pain    Reproducible: Diagnosed as costochondritis  . Palpitations    PVCs noted in ER and on EKG.    HPI: Stacey Underwood is a 42 y.o. female with a PMH below who presents today for ER/Hospital f/u - seen for Chest Pain & palpitations.    Emelda Brothers was referred for cardiology evaluation by the ER physicians for first ER visit for her second.  Recent Hospitalizations: 8/22 & 9/24 ER visits for CP.  August 22: Right upper chest pain associated with some shortness of breath and dizziness.  Pain worse with movement, but not necessarily with exertion.  Lasting about 2 weeks and just got worse.  Pain was reproducible on exam.  EKG normal.  Had some mild PVC activity (this is what prompted cardiology consult). -->  Treated with Toradol in the ER and given Toradol plus steroid taper  September 24: Now left-sided chest pain coming into the center.  Mostly at rest.  Hard to take a deep breath.  Associated with some nausea.  Again tender to palpation.  Again given diagnosis of costochondritis.  Recommended Naprosyn  Studies Personally Reviewed - (if available, images/films reviewed: From Epic Chart or Care Everywhere)  None  Interval History: Stacey Underwood presents here today for ER follow-up stating that she did not really get much relief from either the short steroid taper or Naprosyn.  Certainly Naprosyn helps but it does not make it go away.  The chest pain she is having is upper chest either right side or left side radiating toward the sternum.  He describes it as, sharp type pain that feels like a tugging.  It is not made worse with exertion, but is made worse with certain movements.  She sleeps usually on one side or the other and not flat on her back. She says it is hard to take a deep breath but denies any exertional dyspnea with exception  of the fact it is hard to get a breath because of discomfort.  After she takes the naproxen, for several hours she feels much better and is able to do things fine. No resting dyspnea or PND, orthopnea, edema.  She does not have any real sensation of palpitations, but occasionally does note she will have spells where they seem to be frequent for about hour or 2 and then they go away.  Nothing prolonged.  Nothing to suggest this prolonged tachycardia.  No syncope or near syncope, but she does feel funny when these spells occur.  No TIA or amaurosis fugax.  ROS: A comprehensive was performed. Review of Systems  Constitutional: Negative for chills, fever, malaise/fatigue and weight loss.  HENT: Negative for nosebleeds.   Respiratory: Negative for cough (Had a cold about a month or so before having chest pain), shortness of breath and wheezing.   Gastrointestinal: Negative for blood in stool, heartburn, melena, nausea (Just had nausea with one the episodes of chest pain) and vomiting.  Musculoskeletal: Negative for joint pain.  Neurological: Negative for dizziness, focal weakness and headaches.  Endo/Heme/Allergies:       Feet and hands feel cold  Psychiatric/Behavioral: The patient is not nervous/anxious (Just anxious to find out was going on).    I have reviewed and (if needed) personally updated the patient's problem list, medications, allergies, past medical and surgical history,  social and family history.   Past Medical History:  Diagnosis Date  . Anemia    H/O LAST HGB ON 10-20-15 WAS 12.1  . Family history of adverse reaction to anesthesia    PTS GRANDMA N/V  . GERD (gastroesophageal reflux disease)   . Headache    H/O MIGRAINES    Past Surgical History:  Procedure Laterality Date  . CARPAL TUNNEL RELEASE Bilateral 04/2014  . COLONOSCOPY WITH ESOPHAGOGASTRODUODENOSCOPY (EGD)    . DIRECT LARYNGOSCOPY Right 01/04/2016   Procedure: DIRECT LARYNGOSCOPY WITH MICRO FLAP EXCISION;   Surgeon: Carloyn Manner, MD;  Location: [facility] ORS;  Service: ENT;  Laterality: Right;    Current Meds  Medication Sig  . naproxen (NAPROSYN) 500 MG tablet Take 1 tablet (500 mg total) by mouth 2 (two) times daily with a meal.  . norgestimate-ethinyl estradiol (SPRINTEC 28) 0.25-35 MG-MCG tablet Sprintec (28) 0.25 mg-35 mcg tablet  TAKE 1 TABLET BY MOUTH DAILY FOR 21 DAYS. IMMEDIATELY START A NEW PACK    No Known Allergies  Social History   Tobacco Use  . Smoking status: Never Smoker  . Smokeless tobacco: Never Used  Substance Use Topics  . Alcohol use: Yes    Comment: RARE  . Drug use: No   Social History   Social History Narrative   She is currently' she has been widowed now for about 3 years from her second husband.   She has 2 children, 1 from her first husband who has thrown up in his living separately.  She then has a daughter from her second husband who lives with her still.  She is therefore a single mother.   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  She therefore is no longer working and is living off of Fish farm manager and her husband's life Set designer.   She does not exercise much because she is "busy".    family history includes CAD (age of onset: 21) in her paternal grandmother; Healthy in her brother, father, and mother.  Wt Readings from Last 3 Encounters:  01/20/18 208 lb 3.2 oz (94.4 kg)  12/30/17 202 lb (91.6 kg)  11/27/17 220 lb (99.8 kg)    PHYSICAL EXAM BP 118/76 (BP Location: Right Arm, Patient Position: Sitting, Cuff Size: Normal)   Pulse 77   Ht 5\' 1"  (1.549 m)   Wt 208 lb 3.2 oz (94.4 kg)   LMP 01/12/2018   BMI 39.34 kg/m  Physical Exam  Constitutional: She is oriented to person, place, and time. She appears well-developed and well-nourished. No distress.  Very pleasant  healthy-appearing young woman.  Moderate to morbidly obese but not all truncal.  HENT:  Head: Normocephalic and atraumatic.  Mouth/Throat: Oropharynx is clear and moist.  Eyes: Pupils are equal, round, and reactive to light. Conjunctivae and EOM are normal. No scleral icterus.  Neck: Neck supple. No hepatojugular reflux and no JVD present. Carotid bruit is not present. No tracheal deviation present. No thyromegaly present.  Cardiovascular: Normal rate, regular rhythm, normal heart sounds and intact distal pulses.  Occasional extrasystoles are present. PMI is not displaced. Exam reveals no gallop and no friction rub.  No murmur heard. Pulmonary/Chest: Effort normal and breath sounds normal. No respiratory distress. She has no wheezes. She has no rales. She exhibits tenderness (She does have pretty focal tenderness along pretty  much the second and third ribs on both sides radiating toward the sternal border).  Abdominal: Soft. Bowel sounds are normal. She exhibits no distension. There is no tenderness. There is no rebound.  No HSM.  Musculoskeletal: Normal range of motion. She exhibits no edema.  Lymphadenopathy:    She has no cervical adenopathy.  Neurological: She is alert and oriented to person, place, and time. No cranial nerve deficit.  Psychiatric: She has a normal mood and affect. Her behavior is normal. Judgment and thought content normal.  Vitals reviewed.   Adult ECG Report  Rate: 77;  Rhythm: normal sinus rhythm and Normal axis, intervals and durations.;   Narrative Interpretation: Normal EKG   Other studies Reviewed: Additional studies/ records that were reviewed today include:  Recent Labs:   Lab Results  Component Value Date   CREATININE 0.58 12/30/2017   BUN 13 12/30/2017   NA 136 12/30/2017   K 3.6 12/30/2017   CL 104 12/30/2017   CO2 25 12/30/2017   No results found for: TSH No results found for: CHOL, HDL, LDLCALC, LDLDIRECT, TRIG, CHOLHDL   ASSESSMENT /  PLAN: Problem List Items Addressed This Visit    Frequent PVCs    She had quite a bit of PVCs noted on telemetry in the ER.  She does not seem to be that symptomatic, but I think is more concerned because of what she perceives as an abnormality. No abnormal cardiac exam therefore do not think we need to go to a 2D echo unless she shows a significant burden of PVCs. Plan: 48-hour Holter monitor      Relevant Orders   EKG 12-Lead (Completed)   HOLTER MONITOR - 48 HOUR   Precordial chest pain - Primary    I agree with the ER diagnosis of costochondritis.  She did have some relief with the initial dose of steroids as well as naproxen.  We may just not have hit hard enough.  I will therefore give her more prolonged taper of steroids.  We also talked about how to sleep avoiding lying on one side versus the other.  Recommended that she lies flat with a roll underneath the center of her spine in order to allow her chest to stretch out.  Making this motion actually seem to help her in the clinic.  She will use naproxen and/or Tylenol for breakthrough symptoms, but hopefully we can quell the symptoms with steroids at this point. I do not think this is cardiac and I do not think she needs a stress test because the symptoms are atypical for angina.      Relevant Orders   EKG 12-Lead (Completed)   HOLTER MONITOR - 48 HOUR   Preop cardiovascular exam    At the end of the visit it became apparent that she was being reviewed for possible gynecologic surgery and required cardiac clearance. Her chest pain is not cardiac in nature.  PVCs are not a significant abnormality and there should be no reason to delay her surgery.  She does not need a stress test.  She would be a low risk patient. The monitor is simply to figure out her PVC burden in order to determine if any treatment needs to be done, but should not preclude surgery.         I spent a total of 35 minutes with the patient and chart review. >  50%  of the time was spent in direct patient consultation.  A good portion of this  visit was spent reassuring her confirming the exam finding of reproducible chest pain.  She is relatively convinced that her symptoms are not cardiac, but was not concerned about the palpitations with PVCs noted on her EKG in the ER.  I spent the majority of my time in this reassurance with giving her instructions for how to avoid costochondritis in the future.  Current medicines are reviewed at length with the patient today.  (+/- concerns) some help with Naproxen & the Higher dose steroids - but pain came back after stopped. The following changes have been made:  see below   Patient Instructions  Medication Instructions:  START PREDNISONE  TAPER DOSE FOR PRECORDIAL CHEST PAIN 60 MG   FOR 3 DAYS 40 MG FOR 3 DAYS 20 MG FOR 3 DAYS 10 MG FOR 3 DAYS THEN STOP. If you need a refill on your cardiac medications before your next appointment, please call your pharmacy.   Lab work: Not needed If you have labs (blood work) drawn today and your tests are completely normal, you will receive your results only by: Marland Kitchen MyChart Message (if you have MyChart) OR . A paper copy in the mail If you have any lab test that is abnormal or we need to change your treatment, we will call you to review the results.  Testing/Procedures: Schedule at [place] has recommended that you wear a holter monitor 48 HOURS. Holter monitors are medical devices that record the heart's electrical activity. Doctors most often use these monitors to diagnose arrhythmias. Arrhythmias are problems with the speed or rhythm of the heartbeat. The monitor is a small, portable device. You can wear one while you do your normal daily activities. This is usually used to diagnose what is causing palpitations/syncope (passing out).    Follow-Up: At [facility], you and your health needs are our priority.  As part of our  continuing mission to provide you with exceptional heart care, we have created designated Provider Care Teams.  These Care Teams include your primary Cardiologist (physician) and Advanced Practice Providers (APPs -  Physician Assistants and Nurse Practitioners) who all work together to provide you with the care you need, when you need it. Your physician recommends that you schedule a follow-up appointment in [place].   Any Other Special Instructions Will Be Listed Below (If Applicable).  YOU HAVE CLEARANCE FOR HYSTERECTOMY BY DR Carlis Abbott ( LOW RISK)    Studies Ordered:   Orders Placed This Encounter  Procedures  . HOLTER MONITOR - 48 HOUR  . EKG 12-Lead      Glenetta Hew, M.D., M.S. Interventional Cardiologist   Pager # [phone] Phone # [phone] [address] [place], [place] [address]   Thank you for choosing Heartcare at [facility]!!

## 2018-01-22 ENCOUNTER — Encounter: Payer: Self-pay | Admitting: Cardiology

## 2018-01-22 DIAGNOSIS — Z0000 Encounter for general adult medical examination without abnormal findings: Secondary | ICD-10-CM | POA: Insufficient documentation

## 2018-01-22 DIAGNOSIS — Z01810 Encounter for preprocedural cardiovascular examination: Secondary | ICD-10-CM | POA: Insufficient documentation

## 2018-01-22 DIAGNOSIS — Z1211 Encounter for screening for malignant neoplasm of colon: Secondary | ICD-10-CM | POA: Insufficient documentation

## 2018-01-22 NOTE — Assessment & Plan Note (Signed)
At the end of the visit it became apparent that she was being reviewed for possible gynecologic surgery and required cardiac clearance. Her chest pain is not cardiac in nature.  PVCs are not a significant abnormality and there should be no reason to delay her surgery.  She does not need a stress test.  She would be a low risk patient. The monitor is simply to figure out her PVC burden in order to determine if any treatment needs to be done, but should not preclude surgery.

## 2018-01-22 NOTE — Assessment & Plan Note (Signed)
I agree with the ER diagnosis of costochondritis.  She did have some relief with the initial dose of steroids as well as naproxen.  We may just not have hit hard enough.  I will therefore give her more prolonged taper of steroids.  We also talked about how to sleep avoiding lying on one side versus the other.  Recommended that she lies flat with a roll underneath the center of her spine in order to allow her chest to stretch out.  Making this motion actually seem to help her in the clinic.  She will use naproxen and/or Tylenol for breakthrough symptoms, but hopefully we can quell the symptoms with steroids at this point. I do not think this is cardiac and I do not think she needs a stress test because the symptoms are atypical for angina.

## 2018-01-22 NOTE — Assessment & Plan Note (Signed)
She had quite a bit of PVCs noted on telemetry in the ER.  She does not seem to be that symptomatic, but I think is more concerned because of what she perceives as an abnormality. No abnormal cardiac exam therefore do not think we need to go to a 2D echo unless she shows a significant burden of PVCs. Plan: 48-hour Holter monitor

## 2018-01-27 ENCOUNTER — Ambulatory Visit (INDEPENDENT_AMBULATORY_CARE_PROVIDER_SITE_OTHER): Payer: Medicaid Other

## 2018-01-27 DIAGNOSIS — I493 Ventricular premature depolarization: Secondary | ICD-10-CM

## 2018-01-27 DIAGNOSIS — R072 Precordial pain: Secondary | ICD-10-CM

## 2018-03-12 ENCOUNTER — Ambulatory Visit: Payer: Self-pay | Admitting: Cardiology

## 2018-03-16 ENCOUNTER — Encounter: Payer: Self-pay | Admitting: *Deleted

## 2018-07-03 ENCOUNTER — Ambulatory Visit (INDEPENDENT_AMBULATORY_CARE_PROVIDER_SITE_OTHER): Payer: HRSA Program | Admitting: Physician Assistant

## 2018-07-03 ENCOUNTER — Encounter: Payer: Self-pay | Admitting: Physician Assistant

## 2018-07-03 VITALS — Temp 97.0°F | Wt 215.0 lb

## 2018-07-03 DIAGNOSIS — Z20828 Contact with and (suspected) exposure to other viral communicable diseases: Secondary | ICD-10-CM | POA: Diagnosis not present

## 2018-07-03 DIAGNOSIS — J302 Other seasonal allergic rhinitis: Secondary | ICD-10-CM

## 2018-07-03 DIAGNOSIS — J029 Acute pharyngitis, unspecified: Secondary | ICD-10-CM | POA: Diagnosis not present

## 2018-07-03 DIAGNOSIS — T7840XA Allergy, unspecified, initial encounter: Secondary | ICD-10-CM

## 2018-07-03 MED ORDER — FLUTICASONE PROPIONATE 50 MCG/ACT NA SUSP: 2.0000 | Freq: Every day | NASAL | 6 refills | Status: AC

## 2018-07-03 MED ORDER — MONTELUKAST SODIUM 10 MG PO TABS: 10.0000 mg | ORAL_TABLET | Freq: Every day | ORAL | 3 refills | Status: DC

## 2018-07-03 NOTE — Progress Notes (Signed)
Patient: Stacey Underwood Female    DOB: 09/08/1975   43 y.o.   MRN: [id number] Visit Date: 07/03/2018  Today's Provider: Trinna Post, PA-C   No chief complaint on file.  Subjective:    This visit was conducted virtually due to limitations on office visits due to COVID-19.  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 She reports this past Tuesday she was in contact with an asymptomatic manager who herself was in contact with a person under investigation for COVID-19 who has not yet received test results. Patient reports minor sore throat that improved. Denies fevers, chills, SOB, confusion, lethargy. She has been isolating herself over the past week and wants to know for how long she should do this.   URI   This is a new problem. 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 The problem has been unchanged. There has been no fever. Associated symptoms include coughing, headaches and a sore throat ("sore spot"). She has tried antihistamine (Allegra-D last night) for the symptoms.    No Known Allergies   Current Outpatient Medications:  .  naproxen (NAPROSYN) 500 MG tablet, Take 1 tablet (500 mg total) by mouth 2 (two) times daily with a meal., Disp: 20 tablet, Rfl: 2 .  norgestimate-ethinyl estradiol (SPRINTEC 28) 0.25-35 MG-MCG tablet, Sprintec (28) 0.25 mg-35 mcg tablet  TAKE 1 TABLET BY MOUTH DAILY FOR 21 DAYS. IMMEDIATELY START A NEW PACK, Disp: , Rfl:   Review of Systems  Constitutional: Positive for chills.  HENT: Positive for sore throat ("sore spot").   Respiratory: Positive for cough.   Neurological: Positive for headaches.    Social History   Tobacco Use  . Smoking status: Never Smoker  . Smokeless tobacco: Never Used  Substance Use Topics  . Alcohol use: Yes   Comment: RARE      Objective:   There were no vitals taken for this visit. There were no vitals filed for this visit.   Physical Exam Constitutional:      Appearance: Normal appearance. She is not ill-appearing or toxic-appearing.  Pulmonary:     Effort: Pulmonary effort is normal. No respiratory distress.  Neurological:     Mental Status: She is alert.  Psychiatric:        Mood and Affect: Mood normal.        Behavior: Behavior normal.         Assessment & Plan    1. Allergic state, initial encounter  Explained incubation period for COVID is typically 5 days though asymptomatic people do not transmit the virus as readily as symptomatic people, even though her contact does not have confirmed exposure. Explained if she wanted to be abundantly cautious can quarantine for two more days, though she likely does not have to. She is doing well today, counseled on treatment. Send in below for her allergies.   - montelukast (SINGULAIR) 10 MG tablet; Take 1 tablet (10 mg total) by mouth at bedtime.  Dispense: 90 tablet; Refill: 3 - fluticasone (FLONASE) 50 MCG/ACT nasal spray; Place 2 sprays into both nostrils daily.  Dispense: 16 g; Refill: 6  2. Sore throat  The entirety of the information documented in the History of Present Illness, Review of Systems and Physical Exam were personally obtained by me.  Portions of this information were initially documented by Lyndel Pleasure, CMA and reviewed by me for thoroughness and accuracy.    F/u PRN.   I,Joseline E Rosas,acting as a scribe for Trinna Post, PA-C.,have documented all relevant documentation on the behalf of Trinna Post, PA-C,as directed by  Trinna Post, PA-C while in the presence of Trinna Post, PA-C.  Trinna Post, PA-C  [facility]

## 2018-07-06 NOTE — Patient Instructions (Signed)
Sore Throat  When you have a sore throat, your throat may feel:  · Tender.  · Burning.  · Irritated.  · Scratchy.  · Painful when you swallow.  · Painful when you talk.  Many things can cause a sore throat, such as:  · An infection.  · Allergies.  · Dry air.  · Smoke or pollution.  · Radiation treatment.  · Gastroesophageal reflux disease (GERD).  · A tumor.  A sore throat can be the first sign of another sickness. It can happen with other problems, like:  · Coughing.  · Sneezing.  · Fever.  · Swelling in the neck.  Most sore throats go away without treatment.  Follow these instructions at home:         · Take over-the-counter medicines only as told by your doctor.  ? If your child has a sore throat, do not give your child aspirin.  · Drink enough fluids to keep your pee (urine) pale yellow.  · Rest when you feel you need to.  · To help with pain:  ? Sip warm liquids, such as broth, herbal tea, or warm water.  ? Eat or drink cold or frozen liquids, such as frozen ice pops.  ? Gargle with a salt-water mixture 3-4 times a day or as needed. To make a salt-water mixture, add ½-1 tsp (3-6 g) of salt to 1 cup (237 mL) of warm water. Mix it until you cannot see the salt anymore.  ? Suck on hard candy or throat lozenges.  ? Put a cool-mist humidifier in your bedroom at night.  ? Sit in the bathroom with the door closed for 5-10 minutes while you run hot water in the shower.  · Do not use any products that contain nicotine or tobacco, such as cigarettes, e-cigarettes, and chewing tobacco. If you need help quitting, ask your doctor.  · Wash your hands well and often with soap and water. If soap and water are not available, use hand sanitizer.  Contact a doctor if:  · You have a fever for more than 2-3 days.  · You keep having symptoms for more than 2-3 days.  · Your throat does not get better in 7 days.  · You have a fever and your symptoms suddenly get worse.  · Your child who is 3 months to 3 years old has a temperature of  102.2°F (39°C) or higher.  Get help right away if:  · You have trouble breathing.  · You cannot swallow fluids, soft foods, or your saliva.  · You have swelling in your throat or neck that gets worse.  · You keep feeling sick to your stomach (nauseous).  · You keep throwing up (vomiting).  Summary  · A sore throat is pain, burning, irritation, or scratchiness in the throat. Many things can cause a sore throat.  · Take over-the-counter medicines only as told by your doctor. Do not give your child aspirin.  · Drink plenty of fluids, and rest as needed.  · Contact a doctor if your symptoms get worse or your sore throat does not get better within 7 days.  This information is not intended to replace advice given to you by your health care provider. Make sure you discuss any questions you have with your health care provider.  Document Released: 01/02/2008 Document Revised: 08/25/2017 Document Reviewed: 08/25/2017  Elsevier Interactive Patient Education © 2019 [employer]

## 2019-01-29 ENCOUNTER — Emergency Department
Admission: EM | Admit: 2019-01-29 | Discharge: 2019-01-29 | Disposition: A | Discharge status: Home / Self Care | Payer: Self-pay | Attending: Emergency Medicine | Admitting: Emergency Medicine

## 2019-01-29 ENCOUNTER — Emergency Department: Payer: Self-pay

## 2019-01-29 ENCOUNTER — Encounter: Payer: Self-pay | Admitting: Intensive Care

## 2019-01-29 ENCOUNTER — Other Ambulatory Visit: Payer: Self-pay

## 2019-01-29 DIAGNOSIS — J0100 Acute maxillary sinusitis, unspecified: Secondary | ICD-10-CM

## 2019-01-29 DIAGNOSIS — R197 Diarrhea, unspecified: Secondary | ICD-10-CM | POA: Insufficient documentation

## 2019-01-29 DIAGNOSIS — J988 Other specified respiratory disorders: Secondary | ICD-10-CM | POA: Insufficient documentation

## 2019-01-29 DIAGNOSIS — Z20828 Contact with and (suspected) exposure to other viral communicable diseases: Secondary | ICD-10-CM | POA: Insufficient documentation

## 2019-01-29 DIAGNOSIS — B9789 Other viral agents as the cause of diseases classified elsewhere: Secondary | ICD-10-CM

## 2019-01-29 DIAGNOSIS — Z79899 Other long term (current) drug therapy: Secondary | ICD-10-CM | POA: Insufficient documentation

## 2019-01-29 MED ORDER — FEXOFENADINE-PSEUDOEPHED ER 60-120 MG PO TB12: 1.0000 | ORAL_TABLET | Freq: Two times a day (BID) | ORAL | 0 refills | Status: AC

## 2019-01-29 MED ORDER — BENZONATATE 100 MG PO CAPS: 200.0000 mg | ORAL_CAPSULE | Freq: Three times a day (TID) | ORAL | 0 refills | Status: AC | PRN

## 2019-01-29 MED ORDER — AMOXICILLIN 875 MG PO TABS: 875.0000 mg | ORAL_TABLET | Freq: Two times a day (BID) | ORAL | 0 refills | Status: AC

## 2019-01-29 NOTE — Discharge Instructions (Signed)
Advised self quarantine pending results of COVID-19 test.  Follow discharge care instruction take medication as directed.

## 2019-01-29 NOTE — ED Triage Notes (Signed)
Patient c/o headache, diarrhea, cough, chest pressure when taking a breath. Patient was exposed to daughter who tested positive for covid. Tylenol not giving relief with headache

## 2019-01-29 NOTE — ED Provider Notes (Signed)
[facility] Emergency Department Provider Note   ____________________________________________   First MD Initiated Contact with Patient 01/29/19 1406     (approximate)  I have reviewed the triage vital signs and the nursing notes.   HISTORY  Chief Complaint Headache and Diarrhea    HPI Stacey Underwood is a 43 y.o. female patient complaint of headache, cough, facial and chest bracing, and diarrhea.  Patient states pain pressure increases in the chest when she takes a deep breath.  Patient was exposed to COVID-19 by daughter who tested positive.  Patient rates the pain as a 6/10.  Patient describes her pain as "pressure".  Patient states taking Tylenol for relief.         Past Medical History:  Diagnosis Date  . Anemia    H/O LAST HGB ON 10-20-15 WAS 12.1  . Family history of adverse reaction to anesthesia    PTS GRANDMA N/V  . GERD (gastroesophageal reflux disease)   . Headache    H/O MIGRAINES    Patient Active Problem List   Diagnosis Date Noted  . Preop cardiovascular exam 01/22/2018  . Precordial chest pain 01/20/2018  . 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 Eye pain 06/22/2015  . 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 Urticaria 02/24/2015  . Insomnia 02/24/2015  . Vaginal candidiasis 12/08/2014  . Bacterial vaginosis 10/25/2014  . Acne 10/20/2014  . Allergic reaction 10/20/2014  . Allergic rhinitis 10/20/2014  . Absolute anemia 10/20/2014  . Anxiety 10/20/2014  . Cervical cyst 10/20/2014  . Clinical depression 10/20/2014  . Hypercholesteremia 10/20/2014  . 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 Leiomyoma of uterus 10/20/2014  . Avitaminosis D 10/20/2014  . Vaginal discharge 10/20/2014  . Skin rash 10/20/2014  . 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 CARPAL TUNNEL RELEASE Bilateral 04/2014  . COLONOSCOPY WITH ESOPHAGOGASTRODUODENOSCOPY (EGD)    . DIRECT LARYNGOSCOPY Right  01/04/2016   Procedure: DIRECT LARYNGOSCOPY WITH MICRO FLAP EXCISION;  Surgeon: Carloyn Manner, MD;  Location: [facility] ORS;  Service: ENT;  Laterality: Right;    Prior to Admission medications   Medication Sig Start Date End Date Taking? Authorizing Provider  amoxicillin (AMOXIL) 875 MG tablet Take 1 tablet (875 mg total) by mouth 2 (two) times daily for 10 days. 01/29/19 02/08/19  Sable Feil, PA-C  benzonatate (TESSALON PERLES) 100 MG capsule Take 2 capsules (200 mg total) by mouth 3 (three) times daily as needed. 01/29/19 01/29/20  Sable Feil, PA-C  fexofenadine-pseudoephedrine (ALLEGRA-D) 60-120 MG 12 hr tablet Take 1 tablet by mouth 2 (two) times daily. 01/29/19   Sable Feil, PA-C  fluticasone (FLONASE) 50 MCG/ACT nasal spray Place 2 sprays into both nostrils daily. 07/03/18   Trinna Post, PA-C  levonorgestrel-ethinyl estradiol (SEASONALE,INTROVALE,JOLESSA) 0.15-0.03 MG tablet TAKE 2 TABLETS BY MOUTH DAILY UNTIL BLEEDING STOPS THEN TAKE 1 TABLET DAILY 06/08/18   [provider]  montelukast (SINGULAIR) 10 MG tablet Take 1 tablet (10 mg total) by mouth at bedtime. 07/03/18   Trinna Post, PA-C    Allergies Patient has no known allergies.  Family History  Problem Relation Age of Onset  . Healthy Mother   . Healthy Father   . Healthy Brother   . CAD Paternal Grandmother 22       Had CABG, postop acute renal failure.  Died from complications.    Social History Social History   Tobacco Use  . Smoking status: Never  Smoker  . Smokeless tobacco: Never Used  Substance Use Topics  . Alcohol use: Not Currently    Comment: RARE  . Drug use: No    Review of Systems  Constitutional: No fever/chills Eyes: No visual changes. ENT: No sore throat.  Patient pressure and nasal congestion.  Bilateral ear pressure. Cardiovascular: Denies chest pain. Respiratory:  shortness of breath.  Nonproductive cough with deep inspirations. Gastrointestinal: No abdominal  pain.  No nausea, no vomiting.  Diarrhea..  No constipation. Genitourinary: Negative for dysuria. Musculoskeletal: Negative for back pain. Skin: Negative for rash. Neurological: Negative for headaches, focal weakness or numbness.   ____________________________________________   PHYSICAL EXAM:  VITAL SIGNS: ED Triage Vitals  Enc Vitals Group     BP 01/29/19 1403 128/68     Pulse Rate 01/29/19 1403 90     Resp 01/29/19 1403 16     Temp 01/29/19 1403 98.4 F (36.9 C)     Temp Source 01/29/19 1403 Oral     SpO2 01/29/19 1403 98 %     Weight 01/29/19 1404 210 lb (95.3 kg)     Height 01/29/19 1404 5\' 1"  (1.549 m)     Head Circumference --      Peak Flow --      Pain Score 01/29/19 1407 6     Pain Loc --      Pain Edu? --      Excl. in [place]? --    Constitutional: Alert and oriented. Well appearing and in no acute distress. Eyes: Conjunctivae are normal. PERRL. EOMI. Head: Atraumatic. Nose: Bilateral edematous nasal turbinates.  Bilateral maxillary guarding. Mouth/Throat: Mucous membranes are moist.  Oropharynx non-erythematous.  Postnasal drainage. Neck: No stridor.No cervical spine tenderness to palpation. Hematological/Lymphatic/Immunilogical: No cervical lymphadenopathy. Cardiovascular: Normal rate, regular rhythm. Grossly normal heart sounds.  Good peripheral circulation. Respiratory: Normal respiratory effort.  No retractions. Lungs CTAB. Gastrointestinal: Soft and nontender. No distention. No abdominal bruits. No CVA tenderness. Genitourinary: Deferred Neurologic:  Normal speech and language. No gross focal neurologic deficits are appreciated. No gait instability. Skin:  Skin is warm, dry and intact. No rash noted. Psychiatric: Mood and affect are normal. Speech and behavior are normal.  ____________________________________________   LABS (all labs ordered are listed, but only abnormal results are displayed)  Labs Reviewed  SARS CORONAVIRUS 2 (TAT 6-24 HRS)    ____________________________________________  EKG   ____________________________________________  RADIOLOGY  ED MD interpretation:    Official radiology report(s): Dg Chest Portable 1 View  Result Date: 01/29/2019 CLINICAL DATA:  Cough, recent exposure to COVID-19. EXAM: PORTABLE CHEST 1 VIEW COMPARISON:  12/30/2017 FINDINGS: Subtle opacity in the peripheral left chest new from prior study along with very subtle increased density at the right lung base. No signs of dense consolidation or pleural effusion. Cardiomediastinal contours are stable. No acute bone finding. IMPRESSION: New subtle findings in the left and right chest the could in the appropriate clinical context represent sequela of early viral or atypical infection. Electronically Signed   By: Zetta Bills M.D.   On: 01/29/2019 14:58    ____________________________________________   PROCEDURES  Procedure(s) performed (including Critical Care):  Procedures   ____________________________________________   INITIAL IMPRESSION / ASSESSMENT AND PLAN / ED COURSE  As part of my medical decision making, I reviewed the following data within the [place] was evaluated in Emergency Department on 01/29/2019 for the symptoms described in the history of  present illness. She was evaluated in the context of the global COVID-19 pandemic, which necessitated consideration that the patient might be at risk for infection with the SARS-CoV-2 virus that causes COVID-19. Institutional protocols and algorithms that pertain to the evaluation of patients at risk for COVID-19 are in a state of rapid change based on information released by regulatory bodies including the CDC and federal and state organizations. These policies and algorithms were followed during the patient's care in the ED.   Patient presents with headache, facial pain, cough, chest pressure and diarrhea.  Patient exposed to daughter  who test positive for COVID-19.  Discussed x-ray findings consistent with viral infection.  Patient also has subacute maxillary sinusitis.  Patient given discharge care instruction and medications.  Patient advised self quarantine pending results of COVID-19 test.   ____________________________________________   FINAL CLINICAL IMPRESSION(S) / ED DIAGNOSES  Final diagnoses:  Subacute maxillary sinusitis  Viral respiratory illness     ED Discharge Orders         Ordered    fexofenadine-pseudoephedrine (ALLEGRA-D) 60-120 MG 12 hr tablet  2 times daily     01/29/19 1514    amoxicillin (AMOXIL) 875 MG tablet  2 times daily     01/29/19 1514    benzonatate (TESSALON PERLES) 100 MG capsule  3 times daily PRN     01/29/19 1514           Note:  This document was prepared using Dragon voice recognition software and may include unintentional dictation errors.    Sable Feil, PA-C 01/29/19 1521    Duffy Bruce, MD 01/29/19 [id number]

## 2019-01-30 LAB — SARS CORONAVIRUS 2 (TAT 6-24 HRS): SARS Coronavirus 2: NEGATIVE

## 2019-11-11 ENCOUNTER — Encounter: Payer: Self-pay | Admitting: Emergency Medicine

## 2019-11-11 ENCOUNTER — Other Ambulatory Visit: Payer: Self-pay

## 2019-11-11 ENCOUNTER — Emergency Department: Payer: 59

## 2019-11-11 ENCOUNTER — Emergency Department
Admission: EM | Admit: 2019-11-11 | Discharge: 2019-11-11 | Disposition: A | Discharge status: Home / Self Care | Payer: 59 | Attending: Emergency Medicine | Admitting: Emergency Medicine

## 2019-11-11 DIAGNOSIS — R0789 Other chest pain: Secondary | ICD-10-CM | POA: Insufficient documentation

## 2019-11-11 DIAGNOSIS — R079 Chest pain, unspecified: Secondary | ICD-10-CM

## 2019-11-11 LAB — CBC WITH DIFFERENTIAL/PLATELET
Abs Immature Granulocytes: 0.03 10*3/uL (ref 0.00–0.07)
Basophils Absolute: 0.1 10*3/uL (ref 0.0–0.1)
Basophils Relative: 1 %
Eosinophils Absolute: 0.1 10*3/uL (ref 0.0–0.5)
Eosinophils Relative: 1 %
HCT: 37.6 % (ref 36.0–46.0)
Hemoglobin: 13 g/dL (ref 12.0–15.0)
Immature Granulocytes: 0 %
Lymphocytes Relative: 22 %
Lymphs Abs: 1.8 10*3/uL (ref 0.7–4.0)
MCH: 30.3 pg (ref 26.0–34.0)
MCHC: 34.6 g/dL (ref 30.0–36.0)
MCV: 87.6 fL (ref 80.0–100.0)
Monocytes Absolute: 0.5 10*3/uL (ref 0.1–1.0)
Monocytes Relative: 7 %
Neutro Abs: 5.7 10*3/uL (ref 1.7–7.7)
Neutrophils Relative %: 69 %
Platelets: 441 10*3/uL — ABNORMAL HIGH (ref 150–400)
RBC: 4.29 MIL/uL (ref 3.87–5.11)
RDW: 12.9 % (ref 11.5–15.5)
WBC: 8.2 10*3/uL (ref 4.0–10.5)
nRBC: 0 % (ref 0.0–0.2)

## 2019-11-11 LAB — URINALYSIS, COMPLETE (UACMP) WITH MICROSCOPIC
Bilirubin Urine: NEGATIVE
Glucose, UA: NEGATIVE mg/dL
Hgb urine dipstick: NEGATIVE
Ketones, ur: NEGATIVE mg/dL
Leukocytes,Ua: NEGATIVE
Nitrite: NEGATIVE
Protein, ur: NEGATIVE mg/dL
Specific Gravity, Urine: 1.02 (ref 1.005–1.030)
pH: 5 (ref 5.0–8.0)

## 2019-11-11 LAB — BASIC METABOLIC PANEL
Anion gap: 7 (ref 5–15)
BUN: 12 mg/dL (ref 6–20)
CO2: 26 mmol/L (ref 22–32)
Calcium: 9 mg/dL (ref 8.9–10.3)
Chloride: 105 mmol/L (ref 98–111)
Creatinine, Ser: 0.71 mg/dL (ref 0.44–1.00)
GFR calc Af Amer: >60 mL/min (ref >60)
GFR calc non Af Amer: >60 mL/min (ref >60)
Glucose, Bld: 93 mg/dL (ref 70–99)
Potassium: 3.8 mmol/L (ref 3.5–5.1)
Sodium: 138 mmol/L (ref 135–145)

## 2019-11-11 LAB — TROPONIN I (HIGH SENSITIVITY): Troponin I (High Sensitivity): 3 ng/L (ref <18)

## 2019-11-11 LAB — PREGNANCY, URINE: Preg Test, Ur: NEGATIVE

## 2019-11-11 MED ORDER — KETOROLAC TROMETHAMINE 30 MG/ML IJ SOLN
30.0000 mg | Freq: Once | INTRAMUSCULAR | Status: AC
  Administered 2019-11-11: 30 mg via INTRAMUSCULAR
  Filled 2019-11-11: qty 1

## 2019-11-11 MED ORDER — KETOROLAC TROMETHAMINE 10 MG PO TABS: 10.0000 mg | ORAL_TABLET | Freq: Three times a day (TID) | ORAL | 0 refills | Status: DC

## 2019-11-11 MED ORDER — CYCLOBENZAPRINE HCL 10 MG PO TABS
10.0000 mg | ORAL_TABLET | Freq: Once | ORAL | Status: AC
  Administered 2019-11-11: 10 mg via ORAL
  Filled 2019-11-11: qty 1

## 2019-11-11 MED ORDER — BACLOFEN 10 MG PO TABS: 10.0000 mg | ORAL_TABLET | Freq: Three times a day (TID) | ORAL | 0 refills | Status: AC | PRN

## 2019-11-11 NOTE — Discharge Instructions (Signed)
Your exam, labs, EKG, and chest XR are normal at this time. Your chest pain does not appear to be due to a heart injury or heart attack.  Take the prescription meds as directed. Follow-up with Cardiology as discussed. Return to the ED as needed.

## 2019-11-11 NOTE — ED Triage Notes (Signed)
C/O right sided neck pain since last night.  States has had similar symptoms in the past, but did not have them evaluated.  AAOx3.  Skin warm and dry.  NAD

## 2019-11-11 NOTE — ED Provider Notes (Addendum)
[facility] Emergency Department Provider Note ____________________________________________  Time seen: 1201  I have reviewed the triage vital signs and the nursing notes.  HISTORY  Chief Complaint  Neck Pain  HPI Stacey Underwood is a 44 y.o. female presents her self to the ED accompanied by her husband, for evaluation of somewhat improved right sided anterior chest wall pain.  Patient describes onset of 2 separate episodes of sharp left-sided sternal anterior chest pain at about 4:00 this morning.  She describes the pain lasted approximately a minute or less, and resolved without intervention.  Patient describes nausea without vomiting, no sweating, no shortness of breath, and no aggravating maneuvers.  She reports some mild dizziness related to her symptoms when she reported to the ED.  She denies any propagating factors prior to onset, and has not taken any medications in the interim.  She describes now the sharp pain is resolved and is only left with dull pain in the same area.  She reports a itchy rash to the inner upper arm at this time she gives a remote history of similar chest pain, prompting her to prove presents to the ED for evaluation.  Most recently,  in September 2019.  She was subsequently referred to a cardiologist where she had a work-up which included a 24-hour Holter monitor.  She was found to have multiple PVCs, but no other signs of acute coronary syndrome, acute MI, or any tacky arrhythmia.  Patient was scheduled to follow-up with cardiology following initial evaluation, but reports noncompliance due to a lapse in her insurance coverage.  She has been of her normal level of health and wellbeing in the interim.  She has had episodes similar to the one that brought her to the ED today, but denies any lasting symptoms as is what she is experiencing today.  Patient denies any recent fever, chills, or sweats patient denies any cough, congestion, abdominal pain,  sick contacts, recent travel, or other exposures.  She is present now for evaluation of her symptoms.  She denies any history of alcohol or illicit drug use.  She takes no daily medications other than a oral birth control.   Past Medical History:  Diagnosis Date  . Anemia    H/O LAST HGB ON 10-20-15 WAS 12.1  . Family history of adverse reaction to anesthesia    PTS GRANDMA N/V  . GERD (gastroesophageal reflux disease)   . Headache    H/O MIGRAINES    Patient Active Problem List   Diagnosis Date Noted  . Preop cardiovascular exam 01/22/2018  . Precordial chest pain 01/20/2018  . 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 Eye pain 06/22/2015  . 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 Urticaria 02/24/2015  . Insomnia 02/24/2015  . Vaginal candidiasis 12/08/2014  . Bacterial vaginosis 10/25/2014  . Acne 10/20/2014  . Allergic reaction 10/20/2014  . Allergic rhinitis 10/20/2014  . Absolute anemia 10/20/2014  . Anxiety 10/20/2014  . Cervical cyst 10/20/2014  . Clinical depression 10/20/2014  . Hypercholesteremia 10/20/2014  . 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 Leiomyoma of uterus 10/20/2014  . Avitaminosis D 10/20/2014  . Vaginal discharge 10/20/2014  . Skin rash 10/20/2014  . 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 CARPAL TUNNEL RELEASE Bilateral 04/2014  . COLONOSCOPY WITH ESOPHAGOGASTRODUODENOSCOPY (EGD)    . DIRECT LARYNGOSCOPY Right 01/04/2016   Procedure: DIRECT LARYNGOSCOPY WITH MICRO FLAP EXCISION;  Surgeon: Carloyn Manner, MD;  Location: [facility] ORS;  Service: ENT;  Laterality: Right;    Prior to Admission medications   Medication Sig Start Date End Date Taking? Authorizing Provider  baclofen (LIORESAL) 10 MG tablet Take 1 tablet (10 mg total) by mouth 3 (three) times daily as needed for up to 10 days for muscle spasms. 11/11/19 11/21/19  Cecylia Brazill, Dannielle Karvonen, PA-C  benzonatate (TESSALON PERLES) 100 MG capsule Take 2  capsules (200 mg total) by mouth 3 (three) times daily as needed. 01/29/19 01/29/20  Sable Feil, PA-C  fexofenadine-pseudoephedrine (ALLEGRA-D) 60-120 MG 12 hr tablet Take 1 tablet by mouth 2 (two) times daily. 01/29/19   Sable Feil, PA-C  fluticasone (FLONASE) 50 MCG/ACT nasal spray Place 2 sprays into both nostrils daily. 07/03/18   Trinna Post, PA-C  ketorolac (TORADOL) 10 MG tablet Take 1 tablet (10 mg total) by mouth every 8 (eight) hours. 11/11/19   Aliese Brannum, Dannielle Karvonen, PA-C  levonorgestrel-ethinyl estradiol (SEASONALE,INTROVALE,JOLESSA) 0.15-0.03 MG tablet TAKE 2 TABLETS BY MOUTH DAILY UNTIL BLEEDING STOPS THEN TAKE 1 TABLET DAILY 06/08/18   [provider]  montelukast (SINGULAIR) 10 MG tablet Take 1 tablet (10 mg total) by mouth at bedtime. 07/03/18   Trinna Post, PA-C    Allergies Patient has no known allergies.  Family History  Problem Relation Age of Onset  . Healthy Mother   . Healthy Father   . Healthy Brother   . CAD Paternal Grandmother 26       Had CABG, postop acute renal failure.  Died from complications.    Social History Social History   Tobacco Use  . Smoking status: Never Smoker  . Smokeless tobacco: Never Used  Vaping Use  . Vaping Use: Never used  Substance Use Topics  . Alcohol use: Not Currently    Comment: RARE  . Drug use: No    Review of Systems  Constitutional: Negative for fever. Eyes: Negative for visual changes. ENT: Negative for sore throat. Cardiovascular: Positive for chest pain. Respiratory: Negative for shortness of breath. Gastrointestinal: Negative for abdominal pain, vomiting and diarrhea. Genitourinary: Negative for dysuria. Musculoskeletal: Negative for back pain. Skin: Positive for rash. Neurological: Negative for headaches, focal weakness or numbness. ____________________________________________  PHYSICAL EXAM:  VITAL SIGNS: ED Triage Vitals  Enc Vitals Group     BP 11/11/19 0715 (!) 113/41      Pulse Rate 11/11/19 0715 85     Resp 11/11/19 0715 16     Temp 11/11/19 0715 98.1 F (36.7 C)     Temp Source 11/11/19 0715 Oral     SpO2 11/11/19 0715 98 %     Weight 11/11/19 0713 210 lb 1.6 oz (95.3 kg)     Height 11/11/19 0713 5\' 1"  (1.549 m)     Head Circumference --      Peak Flow --      Pain Score 11/11/19 0713 8     Pain Loc --      Pain Edu? --      Excl. in [place]? --     Constitutional: Alert and oriented. Well appearing and in no distress. GCS = 15 Head: Normocephalic and atraumatic. Eyes: Conjunctivae are normal. Normal extraocular movements Neck: supple normal ROM. No crepitus, midline tenderness, or nuchal rigidity.  Cardiovascular: Normal rate, irregular sinus rhythm. Normal S1/S2. No murmurs, rubs, or gallops. Normal distal pulses. Respiratory: Normal respiratory effort. No wheezes/rales/rhonchi. Gastrointestinal: Soft and nontender. No distention. Musculoskeletal: Nontender with normal range of motion in all extremities.  Neurologic:  Normal gait without ataxia. Normal speech and language. No gross focal neurologic deficits are appreciated. Skin:  Skin is warm, dry and intact. Erythematous papular rash to the upper, inner RUE.  Psychiatric: Mood and affect are normal. Patient exhibits appropriate insight and judgment. ____________________________________________   LABS (pertinent positives/negatives)  Labs Reviewed  CBC WITH DIFFERENTIAL/PLATELET - Abnormal; Notable for the following components:      Result Value   Platelets 441 (*)    All other components within normal limits  URINALYSIS, COMPLETE (UACMP) WITH MICROSCOPIC - Abnormal; Notable for the following components:   Color, Urine YELLOW (*)    APPearance HAZY (*)    Bacteria, UA RARE (*)    All other components within normal limits  BASIC METABOLIC PANEL  PREGNANCY, URINE  TROPONIN I (HIGH SENSITIVITY)  ____________________________________________  EKG  Sinus rhythm w/ arrhythmia 80 bpm PR  Interval 136 ms QRS duration 86 ms Normal axis No STEMI ____________________________________________   RADIOLOGY  CXR  Negative ____________________________________________  PROCEDURES  Toradol 30 mg IM Flexeril 10 mg PO  Procedures ____________________________________________  INITIAL IMPRESSION / ASSESSMENT AND PLAN / ED COURSE  Differential diagnosis includes, but is not limited to, ACS, aortic dissection, pulmonary embolism, cardiac tamponade, pneumothorax, pneumonia, pericarditis, myocarditis, GI-related causes including esophagitis/gastritis, and musculoskeletal chest wall pain, dermatomal nerve pain.   Patient with ED evaluation of fleeting sharp left-sided anterior chest wall pain with now persistent dull chest wall pain.  Patient with a normal exam and work-up at this time.  Labs are reassuring as it showed no leukocyte ptosis or elevated troponin.  Chest x-ray does not show any acute intrathoracic process.  EKG does reveal a sinus arrhythmia but no ST segment changes.  Patient and her husband are reassured overall by the negative work-up at this time.  Patient reports improvement of her symptoms after ED medication administration.  Symptoms may likely represent a noncardiac chest pain including musculoskeletal etiology, costochondritis, or an anxiety component.  Patient is again referred to cardiology for further evaluation and work-up of her symptoms.  She is given usual return precautions.  Stacey Underwood was evaluated in Emergency Department on 11/11/2019 for the symptoms described in the history of present illness. She was evaluated in the context of the global COVID-19 pandemic, which necessitated consideration that the patient might be at risk for infection with the SARS-CoV-2 virus that causes COVID-19. Institutional protocols and algorithms that pertain to the evaluation of patients at risk for COVID-19 are in a state of rapid change based on information released by  regulatory bodies including the CDC and federal and state organizations. These policies and algorithms were followed during the patient's care in the ED. ____________________________________________  FINAL CLINICAL IMPRESSION(S) / ED DIAGNOSES  Final diagnoses:  Chest pain, unspecified type      Wilmer Santillo, Dannielle Karvonen, PA-C 11/11/19 [address] Dannielle Karvonen, PA-C 11/12/19 Mauro Kaufmann    Duffy Bruce, MD 11/13/19 0002

## 2019-11-11 NOTE — ED Notes (Addendum)
Pt st having left sided chest pain this morning at 3am 'it woke me up" radiating to left arm and radiating to left sided neck pain along with nausea and dizziness. St feeling 'sore' on left side of neck from the chest pain St while waiting in the lobby has had intermittent left sided chest pain.  Pt presents with a rash located on left underarm started 3 days ago.

## 2020-06-07 IMAGING — DX DG CHEST 2V
2 series · 2 of 2 positions shown · non-contrast
Comparison: 09/22/2014

CLINICAL DATA: Chest pain several days getting worse. Chest pain
right side with shortness-of-breath.

EXAM:
CHEST - 2 VIEW

[chest pa]
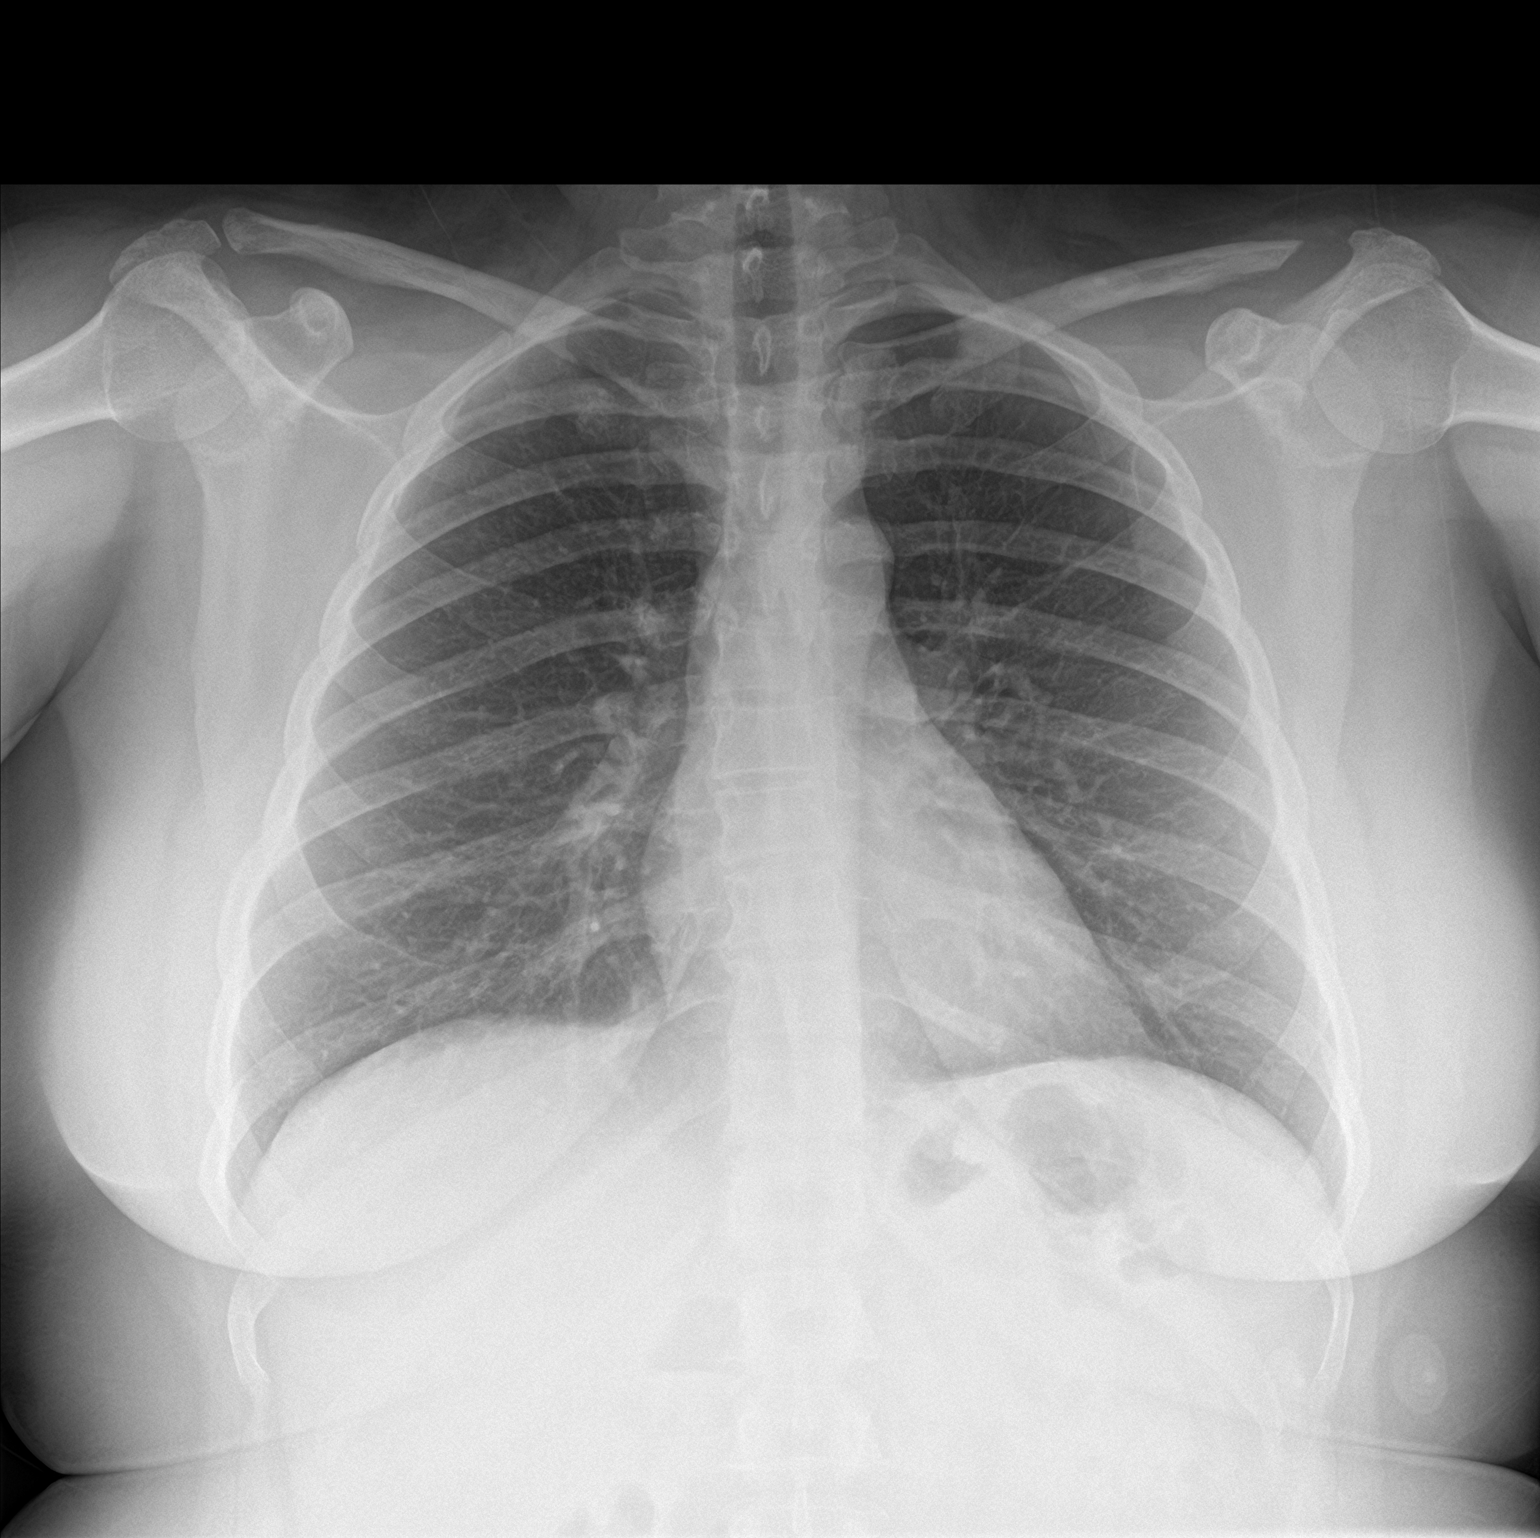

[chest lat]
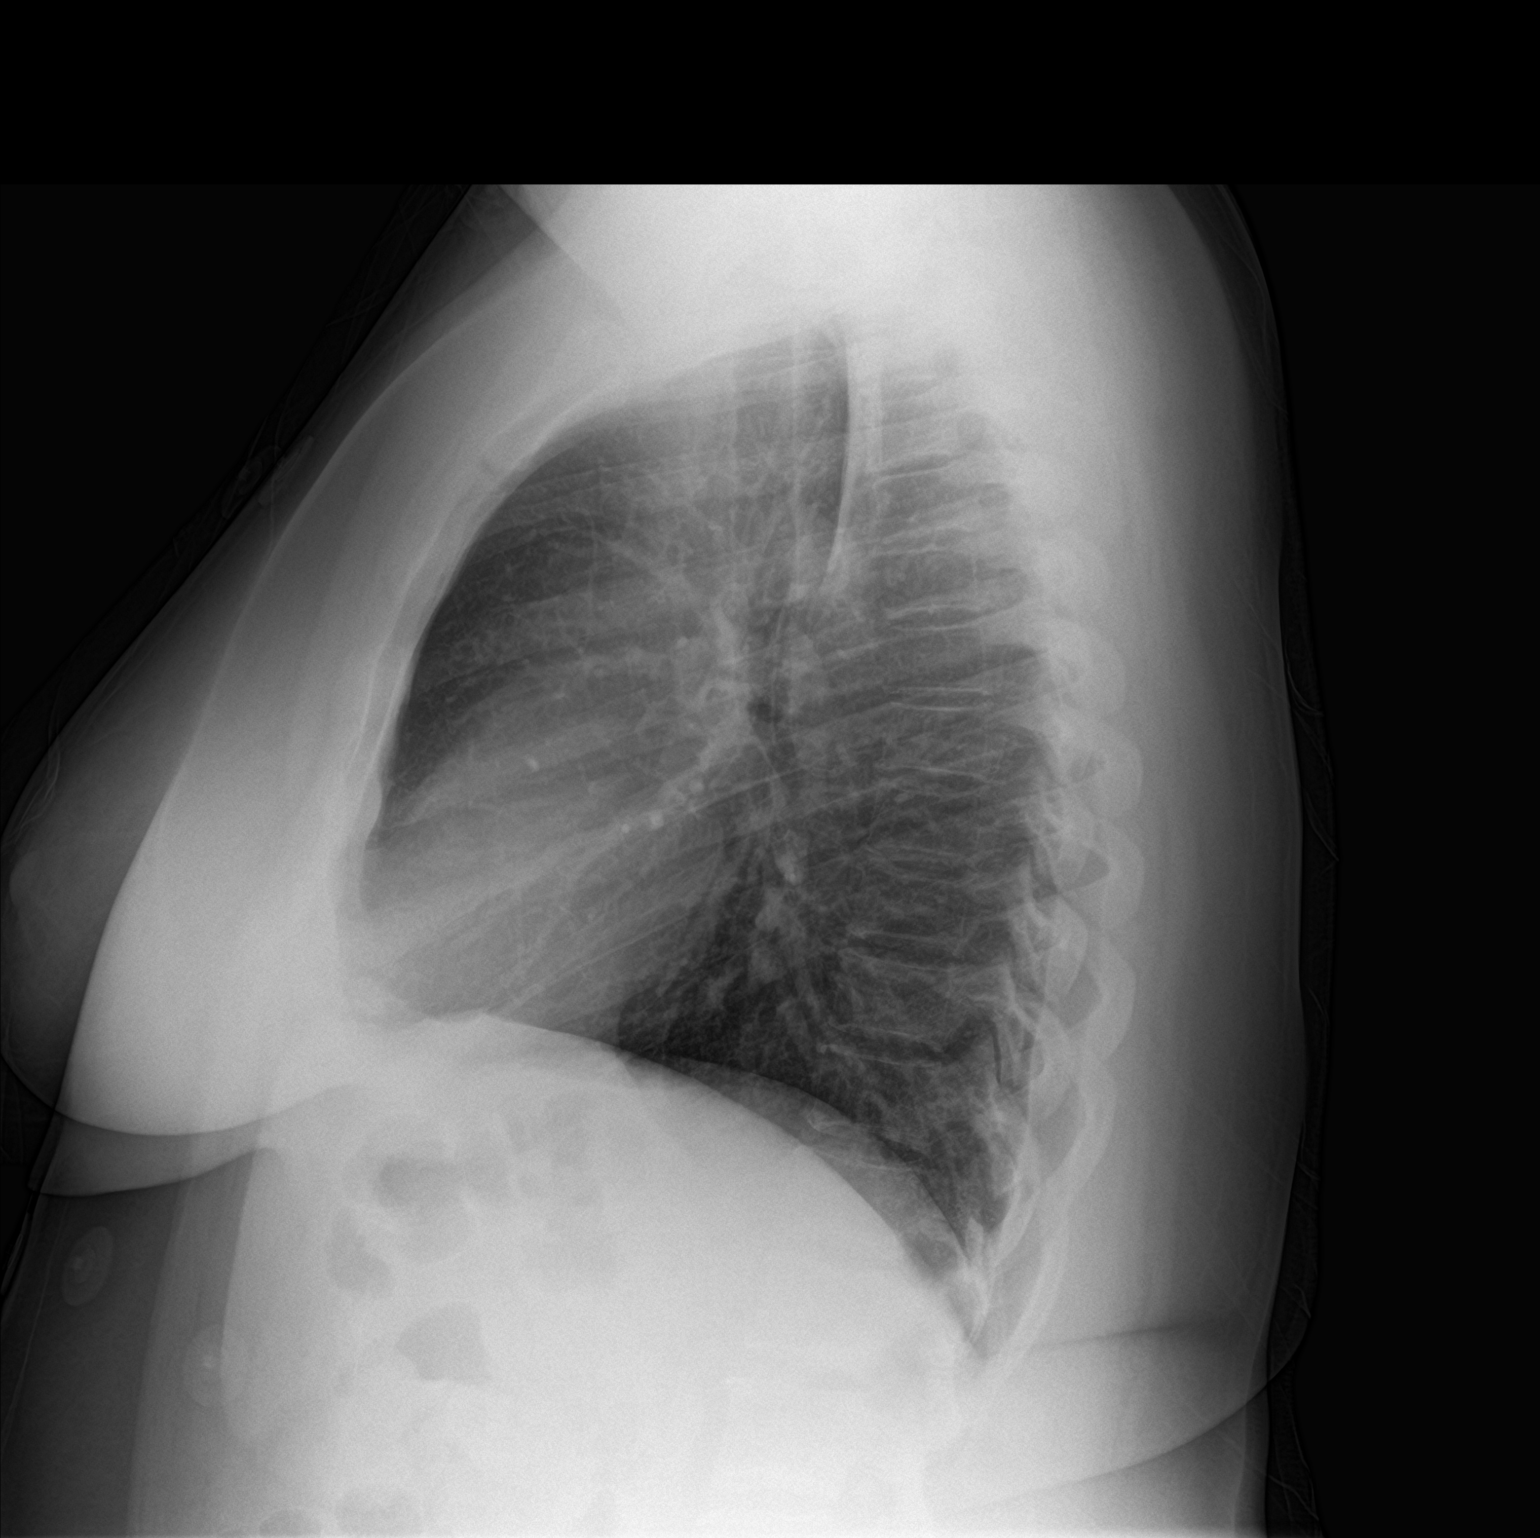

[2 of 2 positions shown; findings below may reference images not displayed]

FINDINGS: The heart size and mediastinal contours are within normal limits.
Both lungs are clear. The visualized skeletal structures are
unremarkable.
IMPRESSION: No active cardiopulmonary disease.

## 2020-09-11 ENCOUNTER — Ambulatory Visit: Payer: Medicaid Other | Admitting: Family Medicine

## 2020-10-25 ENCOUNTER — Telehealth: Payer: Self-pay

## 2020-10-25 NOTE — Telephone Encounter (Signed)
Called patient to cancel her appointment for this Friday but patient declined rescheduling. Reports that she has been rescheduled and that last time it was the same reason that she was reschedule "due to provider having "Covid" really upset and that she wants to be seen by another provider on Friday because she already requested her day off at work. To call her back with time on Friday with another provider and if she doesn't answer to leave a message. [phone]

## 2020-10-27 ENCOUNTER — Encounter: Payer: Medicaid Other | Admitting: Family Medicine

## 2021-03-19 ENCOUNTER — Other Ambulatory Visit: Payer: Self-pay

## 2021-03-19 ENCOUNTER — Emergency Department: Payer: 59

## 2021-03-19 ENCOUNTER — Emergency Department
Admission: EM | Admit: 2021-03-19 | Discharge: 2021-03-19 | Disposition: A | Discharge status: Home / Self Care | Payer: 59 | Attending: Emergency Medicine | Admitting: Emergency Medicine

## 2021-03-19 DIAGNOSIS — H538 Other visual disturbances: Secondary | ICD-10-CM | POA: Diagnosis not present

## 2021-03-19 DIAGNOSIS — D72829 Elevated white blood cell count, unspecified: Secondary | ICD-10-CM | POA: Diagnosis not present

## 2021-03-19 DIAGNOSIS — G9331 Postviral fatigue syndrome: Secondary | ICD-10-CM | POA: Diagnosis not present

## 2021-03-19 DIAGNOSIS — R5383 Other fatigue: Secondary | ICD-10-CM | POA: Diagnosis present

## 2021-03-19 DIAGNOSIS — N390 Urinary tract infection, site not specified: Secondary | ICD-10-CM | POA: Insufficient documentation

## 2021-03-19 DIAGNOSIS — R531 Weakness: Secondary | ICD-10-CM

## 2021-03-19 LAB — URINALYSIS, ROUTINE W REFLEX MICROSCOPIC
Bilirubin Urine: NEGATIVE
Glucose, UA: NEGATIVE mg/dL
Ketones, ur: NEGATIVE mg/dL
Nitrite: NEGATIVE
Protein, ur: NEGATIVE mg/dL
Specific Gravity, Urine: 1.026 (ref 1.005–1.030)
pH: 6 (ref 5.0–8.0)

## 2021-03-19 LAB — CBC WITH DIFFERENTIAL/PLATELET
Abs Immature Granulocytes: 0.14 10*3/uL — ABNORMAL HIGH (ref 0.00–0.07)
Basophils Absolute: 0.1 10*3/uL (ref 0.0–0.1)
Basophils Relative: 1 %
Eosinophils Absolute: 0.2 10*3/uL (ref 0.0–0.5)
Eosinophils Relative: 1 %
HCT: 41.3 % (ref 36.0–46.0)
Hemoglobin: 14.2 g/dL (ref 12.0–15.0)
Immature Granulocytes: 1 %
Lymphocytes Relative: 13 %
Lymphs Abs: 2.2 10*3/uL (ref 0.7–4.0)
MCH: 30 pg (ref 26.0–34.0)
MCHC: 34.4 g/dL (ref 30.0–36.0)
MCV: 87.3 fL (ref 80.0–100.0)
Monocytes Absolute: 0.9 10*3/uL (ref 0.1–1.0)
Monocytes Relative: 5 %
Neutro Abs: 13.8 10*3/uL — ABNORMAL HIGH (ref 1.7–7.7)
Neutrophils Relative %: 79 %
Platelets: 422 10*3/uL — ABNORMAL HIGH (ref 150–400)
RBC: 4.73 MIL/uL (ref 3.87–5.11)
RDW: 12.6 % (ref 11.5–15.5)
WBC: 17.4 10*3/uL — ABNORMAL HIGH (ref 4.0–10.5)
nRBC: 0 % (ref 0.0–0.2)

## 2021-03-19 LAB — HEPATIC FUNCTION PANEL
ALT: 15 U/L (ref 0–44)
AST: 16 U/L (ref 15–41)
Albumin: 3.2 g/dL — ABNORMAL LOW (ref 3.5–5.0)
Alkaline Phosphatase: 59 U/L (ref 38–126)
Bilirubin, Direct: <0.1 mg/dL (ref 0.0–0.2)
Total Bilirubin: 0.6 mg/dL (ref 0.3–1.2)
Total Protein: 7 g/dL (ref 6.5–8.1)

## 2021-03-19 LAB — TROPONIN I (HIGH SENSITIVITY): Troponin I (High Sensitivity): 4 ng/L (ref <18)

## 2021-03-19 LAB — BASIC METABOLIC PANEL
Anion gap: 9 (ref 5–15)
BUN: 20 mg/dL (ref 6–20)
CO2: 23 mmol/L (ref 22–32)
Calcium: 9.1 mg/dL (ref 8.9–10.3)
Chloride: 103 mmol/L (ref 98–111)
Creatinine, Ser: 0.67 mg/dL (ref 0.44–1.00)
GFR, Estimated: >60 mL/min (ref >60)
Glucose, Bld: 108 mg/dL — ABNORMAL HIGH (ref 70–99)
Potassium: 3.6 mmol/L (ref 3.5–5.1)
Sodium: 135 mmol/L (ref 135–145)

## 2021-03-19 LAB — TSH: TSH: 0.861 u[IU]/mL (ref 0.350–4.500)

## 2021-03-19 MED ORDER — ALBUTEROL SULFATE HFA 108 (90 BASE) MCG/ACT IN AERS: 2.0000 | INHALATION_SPRAY | RESPIRATORY_TRACT | 0 refills | Status: DC | PRN

## 2021-03-19 MED ORDER — SODIUM CHLORIDE 0.9 % IV BOLUS
1000.0000 mL | Freq: Once | INTRAVENOUS | Status: AC
  Administered 2021-03-19: 1000 mL via INTRAVENOUS

## 2021-03-19 MED ORDER — CEPHALEXIN 500 MG PO CAPS
500.0000 mg | ORAL_CAPSULE | Freq: Two times a day (BID) | ORAL | 0 refills | Status: AC
Start: 2021-03-19 — End: 2021-03-26

## 2021-03-19 NOTE — ED Provider Notes (Signed)
[facility] Emergency Department Provider Note ____________________________________________   Event Date/Time   First MD Initiated Contact with Patient 03/19/21 1546     (approximate)  I have reviewed the triage vital signs and the nursing notes.   HISTORY  Chief Complaint Shortness of Breath and URI    HPI Stacey Underwood is a 45 y.o. female with PMH as noted below including history of anemia who presents with multiple symptoms over the last week after being diagnosed with the flu in late November.  The patient reports generalized fatigue and decreased exercise tolerance, shortness of breath especially on exertion, lightheadedness, as well as some bilateral lower abdominal pain, headache, and a feeling of fullness in her ears.  Sometimes she gets brief bilateral blurred vision when she feels lightheaded.  It resolves spontaneously.  She denies cough or ongoing fever.  She has no vomiting but is still having some diarrhea.  She has taken prednisone, Tessalon Perles, and azithromycin without relief.  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   Urticaria 02/24/2015   Insomnia 02/24/2015   Vaginal candidiasis 12/08/2014   Bacterial vaginosis 10/25/2014   Acne 10/20/2014   Allergic reaction 10/20/2014   Allergic rhinitis 10/20/2014   Absolute anemia 10/20/2014   Anxiety 10/20/2014   Cervical cyst 10/20/2014   Clinical depression 10/20/2014   Hypercholesteremia 10/20/2014   Borderline diabetes 10/20/2014   Leiomyoma of uterus 10/20/2014   Avitaminosis D 10/20/2014    Vaginal discharge 10/20/2014   Skin rash 10/20/2014   Major depressive disorder with single episode 10/20/2014    Past Surgical History:  Procedure Laterality Date   CARPAL TUNNEL RELEASE Bilateral 04/2014   COLONOSCOPY WITH ESOPHAGOGASTRODUODENOSCOPY (EGD)     DIRECT LARYNGOSCOPY Right 01/04/2016   Procedure: DIRECT LARYNGOSCOPY WITH MICRO FLAP EXCISION;  Surgeon: Carloyn Manner, MD;  Location: [facility] ORS;  Service: ENT;  Laterality: Right;    Prior to Admission medications   Medication Sig Start Date End Date Taking? Authorizing Provider  albuterol (VENTOLIN HFA) 108 (90 Base) MCG/ACT inhaler Inhale 2 puffs into the lungs every 4 (four) hours as needed for shortness of breath. 03/19/21  Yes Arta Silence, MD  cephALEXin (KEFLEX) 500 MG capsule Take 1 capsule (500 mg total) by mouth 2 (two) times daily for 7 days. 03/19/21 03/26/21 Yes Arta Silence, MD  fexofenadine-pseudoephedrine (ALLEGRA-D) 60-120 MG 12 hr tablet Take 1 tablet by mouth 2 (two) times daily. 01/29/19   Sable Feil, PA-C  fluticasone (FLONASE) 50 MCG/ACT nasal spray Place 2 sprays into both nostrils daily. 07/03/18   Trinna Post, PA-C  ketorolac (TORADOL) 10 MG tablet Take 1 tablet (10 mg total) by mouth every 8 (eight) hours. 11/11/19   Menshew, Dannielle Karvonen, PA-C  levonorgestrel-ethinyl estradiol (SEASONALE,INTROVALE,JOLESSA) 0.15-0.03 MG tablet TAKE 2 TABLETS BY MOUTH DAILY UNTIL BLEEDING STOPS THEN TAKE 1 TABLET DAILY 06/08/18   [provider]  montelukast (SINGULAIR) 10 MG tablet Take 1 tablet (10 mg total) by mouth at bedtime. 07/03/18   Trinna Post,  PA-C    Allergies Patient has no known allergies.  Family History  Problem Relation Age of Onset   Healthy Mother    Healthy Father    Healthy Brother    CAD Paternal Grandmother 1       Had CABG, postop acute renal failure.  Died from complications.    Social History Social History   Tobacco Use   Smoking status: Never    Smokeless tobacco: Never  Vaping Use   Vaping Use: Never used  Substance Use Topics   Alcohol use: Not Currently    Comment: RARE   Drug use: No    Review of Systems  Constitutional: No fever.  Positive for fatigue. Eyes: No visual changes. ENT: No sore throat. Cardiovascular: Denies chest pain. Respiratory: Positive for shortness of breath. Gastrointestinal: No vomiting.  Positive for diarrhea.  Genitourinary: Negative for dysuria.  Musculoskeletal: Negative for back pain. Skin: Negative for rash. Neurological: Positive for headache.   ____________________________________________   PHYSICAL EXAM:  VITAL SIGNS: ED Triage Vitals  Enc Vitals Group     BP 03/19/21 1402 114/75     Pulse Rate 03/19/21 1402 97     Resp 03/19/21 1402 18     Temp 03/19/21 1402 97.8 F (36.6 C)     Temp Source 03/19/21 1402 Oral     SpO2 03/19/21 1402 97 %     Weight 03/19/21 1359 215 lb (97.5 kg)     Height 03/19/21 1359 5\' 1"  (1.549 m)     Head Circumference --      Peak Flow --      Pain Score 03/19/21 1359 5     Pain Loc --      Pain Edu? --      Excl. in [place]? --     Constitutional: Alert and oriented. Well appearing and in no acute distress. Eyes: Conjunctivae are normal.  Head: Atraumatic.  TMs appear normal bilaterally. Nose: No congestion/rhinnorhea. Mouth/Throat: Mucous membranes are moist.   Neck: Normal range of motion.  Cardiovascular: Normal rate, regular rhythm. Grossly normal heart sounds.  Good peripheral circulation. Respiratory: Normal respiratory effort.  No retractions. Lungs CTAB. Gastrointestinal: Soft and nontender. No distention.  Genitourinary: No flank tenderness. Musculoskeletal: Extremities warm and well perfused.  Neurologic:  Normal speech and language. No gross focal neurologic deficits are appreciated.  Skin:  Skin is warm and dry. No rash noted. Psychiatric: Mood and affect are normal. Speech and behavior are  normal.  ____________________________________________   LABS (all labs ordered are listed, but only abnormal results are displayed)  Labs Reviewed  BASIC METABOLIC PANEL - Abnormal; Notable for the following components:      Result Value   Glucose, Bld 108 (*)    All other components within normal limits  HEPATIC FUNCTION PANEL - Abnormal; Notable for the following components:   Albumin 3.2 (*)    All other components within normal limits  CBC WITH DIFFERENTIAL/PLATELET - Abnormal; Notable for the following components:   WBC 17.4 (*)    Platelets 422 (*)    Neutro Abs 13.8 (*)    Abs Immature Granulocytes 0.14 (*)    All other components within normal limits  URINALYSIS, ROUTINE W REFLEX MICROSCOPIC - Abnormal; Notable for the following components:   Color, Urine AMBER (*)    APPearance HAZY (*)    Hgb urine dipstick SMALL (*)    Leukocytes,Ua TRACE (*)    Bacteria, UA RARE (*)    All other  components within normal limits  TSH  TROPONIN I (HIGH SENSITIVITY)   ____________________________________________  EKG  ED ECG REPORT I, Arta Silence, the attending physician, personally viewed and interpreted this ECG.  Date: 03/19/2021 EKG Time: 1401 Rate: 98 Rhythm: normal sinus rhythm QRS Axis: normal Intervals: normal ST/T Wave abnormalities: normal Narrative Interpretation: no evidence of acute ischemia  ____________________________________________  RADIOLOGY  Chest x-ray interpreted by me shows no focal consolidation or edema  ____________________________________________   PROCEDURES  Procedure(s) performed: No  Procedures  Critical Care performed: No ____________________________________________   INITIAL IMPRESSION / ASSESSMENT AND PLAN / ED COURSE  Pertinent labs & imaging results that were available during my care of the patient were reviewed by me and considered in my medical decision making (see chart for details).   45 year old female with  PMH as noted above presents with persistent weakness, dizziness, shortness of breath, and diarrhea after being diagnosed with flu late last month.  I reviewed the past medical records in epic and confirmed the positive flu diagnosis on 11/30.  The patient was seen by her PMD Dr. Duanne Moron on 12/5 and prescribed azithromycin for possible sinusitis, prednisone and Tessalon, and Zofran.  On exam today the patient is well-appearing and her vital signs are normal.  Physical exam is unremarkable.  Lungs are clear to auscultation.  Mucous membranes are moist.  Chest x-ray and EKG obtained from triage are unremarkable.  Differential includes prolonged post flu symptoms, dehydration, electrolyte abnormality, or other metabolic cause, anemia, hypothyroidism, UTI, or less likely cardiac cause.  There is no clinical evidence for PE, and the patient is PERC negative.  We will obtain lab work-up including basic labs, LFTs, UA, troponin, and TSH.  ----------------------------------------- 7:06 PM on 03/19/2021 -----------------------------------------  Lab work-up is unremarkable.  Chemistry is normal.  TSH is normal, and the troponin is negative.  CBC shows leukocytosis although this is likely due to the patient just having finished a steroid course.  However, the urinalysis does show findings compatible with UTI.  The patient does report bilateral lower abdominal and suprapubic discomfort although she does not have dysuria.  Overall I suspect that her ongoing symptoms are either due to UTI, post flu syndrome, bronchitis/reactive airways, dehydration, or likely combination thereof.  At this time, given overall reassuring work-up she is stable for discharge home.  I will prescribe Keflex for UTI as well as albuterol for symptomatic treatment of the shortness of breath.  I counseled the patient on the results of the work-up and the plan of care.  Return precautions given, and she expresses  understanding.  ____________________________________________   FINAL CLINICAL IMPRESSION(S) / ED DIAGNOSES  Final diagnoses:  Generalized weakness  Post-influenza syndrome  Urinary tract infection without hematuria, site unspecified      NEW MEDICATIONS STARTED DURING THIS VISIT:  New Prescriptions   ALBUTEROL (VENTOLIN HFA) 108 (90 BASE) MCG/ACT INHALER    Inhale 2 puffs into the lungs every 4 (four) hours as needed for shortness of breath.   CEPHALEXIN (KEFLEX) 500 MG CAPSULE    Take 1 capsule (500 mg total) by mouth 2 (two) times daily for 7 days.     Note:  This document was prepared using Dragon voice recognition software and may include unintentional dictation errors.    Arta Silence, MD 03/19/21 Pauline Aus

## 2021-03-19 NOTE — Discharge Instructions (Addendum)
Make sure to drink plenty of fluids.  Take the Keflex as prescribed and finish the full 7-day course.  This is for the likely bladder infection.  You may use the albuterol inhaler up to every 4 hours as needed for shortness of breath or chest tightness.  Follow-up with your primary care doctor in approximately 1 week.  Return to the ER for new, worsening, or persistent shortness of breath, generalized weakness, fever, vomiting, chest pain, vision changes, or any other new or worsening symptoms that concern you.

## 2021-03-19 NOTE — ED Triage Notes (Signed)
Pt states she has not been feeling well for the past 3 weeks, dx with the flu, chest pain, SOB, HA, cough, congestion.

## 2021-08-09 IMAGING — DX DG CHEST 1V PORT
1 series · 1 of 1 positions shown · non-contrast
Comparison: 12/30/2017

CLINICAL DATA: Cough, recent exposure to [id number].

EXAM:
PORTABLE CHEST 1 VIEW

[chest ap]
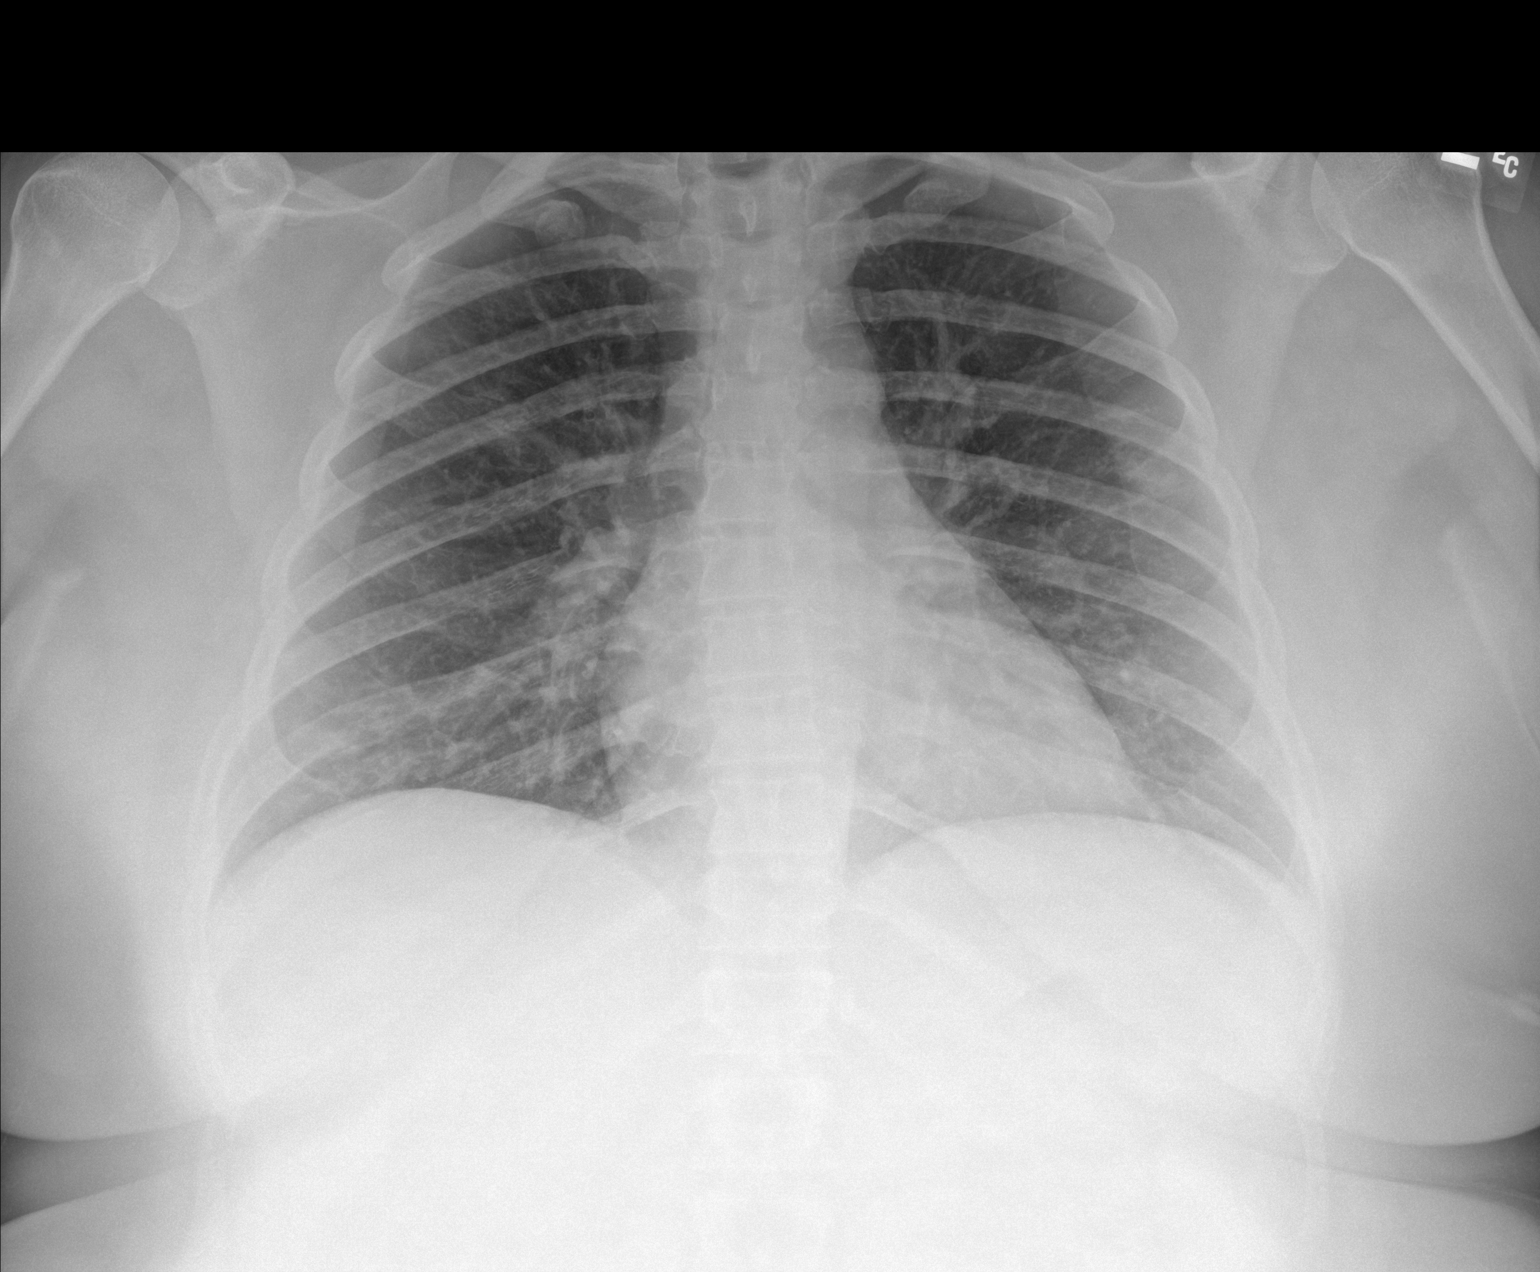

[1 of 1 positions shown; findings below may reference images not displayed]

FINDINGS: Subtle opacity in the peripheral left chest new from prior study
along with very subtle increased density at the right lung base. No
signs of dense consolidation or pleural effusion. Cardiomediastinal
contours are stable.

No acute bone finding.
IMPRESSION: New subtle findings in the left and right chest the could in the
appropriate clinical context represent sequela of early viral or
atypical infection.

## 2022-01-16 DIAGNOSIS — Z01419 Encounter for gynecological examination (general) (routine) without abnormal findings: Secondary | ICD-10-CM | POA: Diagnosis not present

## 2022-01-16 DIAGNOSIS — Z6839 Body mass index (BMI) 39.0-39.9, adult: Secondary | ICD-10-CM | POA: Diagnosis not present

## 2022-01-16 DIAGNOSIS — Z1231 Encounter for screening mammogram for malignant neoplasm of breast: Secondary | ICD-10-CM | POA: Diagnosis not present

## 2022-08-30 ENCOUNTER — Other Ambulatory Visit: Payer: Self-pay

## 2022-08-30 ENCOUNTER — Emergency Department: Payer: 59

## 2022-08-30 ENCOUNTER — Encounter: Payer: Self-pay | Admitting: Emergency Medicine

## 2022-08-30 ENCOUNTER — Emergency Department
Admission: EM | Admit: 2022-08-30 | Discharge: 2022-08-30 | Disposition: A | Discharge status: Home / Self Care | Payer: 59 | Attending: Emergency Medicine | Admitting: Emergency Medicine

## 2022-08-30 DIAGNOSIS — R1013 Epigastric pain: Secondary | ICD-10-CM | POA: Insufficient documentation

## 2022-08-30 DIAGNOSIS — K449 Diaphragmatic hernia without obstruction or gangrene: Secondary | ICD-10-CM | POA: Diagnosis not present

## 2022-08-30 DIAGNOSIS — R112 Nausea with vomiting, unspecified: Secondary | ICD-10-CM | POA: Diagnosis not present

## 2022-08-30 DIAGNOSIS — R1011 Right upper quadrant pain: Secondary | ICD-10-CM | POA: Diagnosis not present

## 2022-08-30 DIAGNOSIS — R9431 Abnormal electrocardiogram [ECG] [EKG]: Secondary | ICD-10-CM | POA: Diagnosis not present

## 2022-08-30 DIAGNOSIS — R109 Unspecified abdominal pain: Secondary | ICD-10-CM | POA: Diagnosis not present

## 2022-08-30 DIAGNOSIS — R1010 Upper abdominal pain, unspecified: Secondary | ICD-10-CM | POA: Diagnosis not present

## 2022-08-30 LAB — COMPREHENSIVE METABOLIC PANEL
ALT: 20 U/L (ref 0–44)
AST: 20 U/L (ref 15–41)
Albumin: 3.8 g/dL (ref 3.5–5.0)
Alkaline Phosphatase: 65 U/L (ref 38–126)
Anion gap: 6 (ref 5–15)
BUN: 14 mg/dL (ref 6–20)
CO2: 23 mmol/L (ref 22–32)
Calcium: 8.6 mg/dL — ABNORMAL LOW (ref 8.9–10.3)
Chloride: 105 mmol/L (ref 98–111)
Creatinine, Ser: 0.62 mg/dL (ref 0.44–1.00)
GFR, Estimated: >60 mL/min (ref >60)
Glucose, Bld: 130 mg/dL — ABNORMAL HIGH (ref 70–99)
Potassium: 3.7 mmol/L (ref 3.5–5.1)
Sodium: 134 mmol/L — ABNORMAL LOW (ref 135–145)
Total Bilirubin: 0.5 mg/dL (ref 0.3–1.2)
Total Protein: 7 g/dL (ref 6.5–8.1)

## 2022-08-30 LAB — CBC WITH DIFFERENTIAL/PLATELET
Abs Immature Granulocytes: 0.08 10*3/uL — ABNORMAL HIGH (ref 0.00–0.07)
Basophils Absolute: 0.1 10*3/uL (ref 0.0–0.1)
Basophils Relative: 0 %
Eosinophils Absolute: 0.1 10*3/uL (ref 0.0–0.5)
Eosinophils Relative: 0 %
HCT: 38.1 % (ref 36.0–46.0)
Hemoglobin: 13.1 g/dL (ref 12.0–15.0)
Immature Granulocytes: 1 %
Lymphocytes Relative: 3 %
Lymphs Abs: 0.5 10*3/uL — ABNORMAL LOW (ref 0.7–4.0)
MCH: 30 pg (ref 26.0–34.0)
MCHC: 34.4 g/dL (ref 30.0–36.0)
MCV: 87.2 fL (ref 80.0–100.0)
Monocytes Absolute: 0.5 10*3/uL (ref 0.1–1.0)
Monocytes Relative: 3 %
Neutro Abs: 15.3 10*3/uL — ABNORMAL HIGH (ref 1.7–7.7)
Neutrophils Relative %: 93 %
Platelets: 405 10*3/uL — ABNORMAL HIGH (ref 150–400)
RBC: 4.37 MIL/uL (ref 3.87–5.11)
RDW: 12.3 % (ref 11.5–15.5)
WBC: 16.5 10*3/uL — ABNORMAL HIGH (ref 4.0–10.5)
nRBC: 0 % (ref 0.0–0.2)

## 2022-08-30 LAB — URINALYSIS, ROUTINE W REFLEX MICROSCOPIC
Bacteria, UA: NONE SEEN
Bilirubin Urine: NEGATIVE
Glucose, UA: NEGATIVE mg/dL
Ketones, ur: NEGATIVE mg/dL
Leukocytes,Ua: NEGATIVE
Nitrite: NEGATIVE
Protein, ur: 30 mg/dL — AB
RBC / HPF: >50 RBC/hpf (ref 0–5)
Specific Gravity, Urine: 1.021 (ref 1.005–1.030)
pH: 7 (ref 5.0–8.0)

## 2022-08-30 LAB — POC URINE PREG, ED: Preg Test, Ur: NEGATIVE

## 2022-08-30 LAB — TROPONIN I (HIGH SENSITIVITY): Troponin I (High Sensitivity): 3 ng/L (ref <18)

## 2022-08-30 LAB — LIPASE, BLOOD: Lipase: 28 U/L (ref 11–51)

## 2022-08-30 MED ORDER — SODIUM CHLORIDE 0.9 % IV BOLUS
1000.0000 mL | Freq: Once | INTRAVENOUS | Status: AC
  Administered 2022-08-30: 1000 mL via INTRAVENOUS

## 2022-08-30 MED ORDER — IOHEXOL 300 MG/ML  SOLN
100.0000 mL | Freq: Once | INTRAMUSCULAR | Status: AC | PRN
  Administered 2022-08-30: 100 mL via INTRAVENOUS

## 2022-08-30 MED ORDER — ONDANSETRON 4 MG PO TBDP: 4.0000 mg | ORAL_TABLET | Freq: Three times a day (TID) | ORAL | 0 refills | Status: DC | PRN

## 2022-08-30 MED ORDER — ONDANSETRON HCL 4 MG/2ML IJ SOLN
4.0000 mg | Freq: Once | INTRAMUSCULAR | Status: AC
Start: 2022-08-30 — End: 2022-08-30
  Administered 2022-08-30: 4 mg via INTRAVENOUS
  Filled 2022-08-30: qty 2

## 2022-08-30 MED ORDER — PANTOPRAZOLE SODIUM 40 MG IV SOLR
40.0000 mg | Freq: Once | INTRAVENOUS | Status: AC
  Administered 2022-08-30: 40 mg via INTRAVENOUS
  Filled 2022-08-30: qty 10

## 2022-08-30 NOTE — Discharge Instructions (Signed)

## 2022-08-30 NOTE — ED Provider Notes (Signed)
[facility] Provider Note    Event Date/Time   First MD Initiated Contact with Patient 08/30/22 [phone]     (approximate)   History   Abdominal Pain   HPI  Stacey Underwood is a 47 y.o. female with no significant past medical history presents to the emergency department with abdominal pain associate with nausea, vomiting.  States that at midnight woke up and cold sweats, felt nauseated and not feeling well.  Went to the bathroom and had a bowel movement that was normal, no diarrhea and no blood in her stool.  Couple of hours later again woke up in a sweat and feeling nauseous, multiple episodes of vomiting at that time.  Feeling lightheaded.  Denies any significant headache, change in vision, slurring of speech, extremity numbness or weakness.  Currently complaining of a burning sensation to her upper abdomen that goes up to her chest.  No association with shortness of breath.  No prior abdominal surgery.  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  Denies any daily medications and does not follow with a primary care provider.  No family history of coronary artery disease at a young age that she knows of, coronary artery disease in her grandparents.  Denies any tobacco use, marijuana use or alcohol use.  No history of DVT or PE.  Ate [employer] last night for dinner with her daughter, her daughter has not been having any symptoms.  No daily NSAID use.  No concern for pregnancy, on her menstrual cycle.     Physical Exam   Triage Vital Signs: ED Triage Vitals  Enc Vitals Group     BP 08/30/22 0628 139/76     Pulse Rate 08/30/22 0628 86     Resp 08/30/22 0628 18     Temp 08/30/22 0628 (!) 97.5 F (36.4 C)     Temp Source 08/30/22 0628 Oral     SpO2 08/30/22 0628 99 %     Weight 08/30/22 0623 215 lb (97.5 kg)     Height 08/30/22 0623 5\' 1"  (1.549 m)     Head Circumference --      Peak Flow --      Pain Score 08/30/22 0623 8     Pain Loc --      Pain  Edu? --      Excl. in GC? --     Most recent vital signs: Vitals:   08/30/22 0628  BP: 139/76  Pulse: 86  Resp: 18  Temp: (!) 97.5 F (36.4 C)  SpO2: 99%    Physical Exam Constitutional:      Appearance: She is well-developed.  HENT:     Head: Atraumatic.  Eyes:     Conjunctiva/sclera: Conjunctivae normal.  Cardiovascular:     Rate and Rhythm: Regular rhythm.  Pulmonary:     Effort: No respiratory distress.  Abdominal:     General: There is no distension.     Tenderness: There is abdominal tenderness in the right upper quadrant and epigastric area. There is no right CVA tenderness or left CVA tenderness. Negative signs include Murphy's sign and McBurney's sign.  Musculoskeletal:        General: Normal range of motion.     Cervical back: Normal range of motion.  Skin:    General: Skin is warm.     Capillary Refill: Capillary refill takes less than 2 seconds.  Neurological:     Mental Status: She is alert. Mental  status is at baseline.     GCS: GCS eye subscore is 4. GCS verbal subscore is 5. GCS motor subscore is 6.     Cranial Nerves: Cranial nerves 2-12 are intact.     Sensory: Sensation is intact.     Motor: Motor function is intact.     Coordination: Coordination is intact.     Gait: Gait is intact.     IMPRESSION / MDM / ASSESSMENT AND PLAN / ED COURSE  I reviewed the triage vital signs and the nursing notes.  Differential diagnosis including viral illness including viral gastroenteritis, gastritis/PUD, pancreatitis, symptomatic cholelithiasis, acute cholecystitis, ACS.  Low suspicion for posterior circulation CVA, no dysmetria, no dizziness and able to ambulate in the emergency department without any difficulties.  EKG  I, Corena Herter, the attending physician, personally viewed and interpreted this ECG.   Rate: Normal  Rhythm: Normal sinus  Axis: Normal  Intervals: Normal  ST&T Change: None  No tachycardic or bradycardic dysrhythmias while on  cardiac telemetry.  LABS (all labs ordered are listed, but only abnormal results are displayed) Labs interpreted as -    Labs Reviewed  CBC WITH DIFFERENTIAL/PLATELET  COMPREHENSIVE METABOLIC PANEL  LIPASE, BLOOD  URINALYSIS, ROUTINE W REFLEX MICROSCOPIC  POC URINE PREG, ED  TROPONIN I (HIGH SENSITIVITY)     MDM    Patient given 1 L of IV fluids, IV Protonix and IV Zofran.  Plan for lab work and right upper quadrant abdominal ultrasound.   PROCEDURES:  Critical Care performed: No  Procedures  Patient's presentation is most consistent with acute presentation with potential threat to life or bodily function.   MEDICATIONS ORDERED IN ED: Medications  sodium chloride 0.9 % bolus 1,000 mL (has no administration in time range)  ondansetron (ZOFRAN) injection 4 mg (has no administration in time range)  pantoprazole (PROTONIX) injection 40 mg (has no administration in time range)    FINAL CLINICAL IMPRESSION(S) / ED DIAGNOSES   Final diagnoses:  Epigastric pain  Nausea and vomiting, unspecified vomiting type     Rx / DC Orders   ED Discharge Orders     None        Note:  This document was prepared using Dragon voice recognition software and may include unintentional dictation errors.   Corena Herter, MD 08/30/22 [phone]

## 2022-08-30 NOTE — ED Provider Notes (Signed)
Patient received in signout from Dr. Arnoldo Morale pending follow-up labs and ultrasound.  Patient with leukocytosis.  Ultrasound without evidence of acute pathology.  Was having some right lower quadrant pain on repeat exam therefore CT imaging ordered to evaluate for acute appendicitis.  CT imaging without evidence of acute findings.  She is tolerating p.o. does appear stable and appropriate for outpatient follow-up.  We discussed strict return precautions.   Willy Eddy, MD 08/30/22 [phone]

## 2022-08-30 NOTE — ED Triage Notes (Signed)
Patient ambulatory to triage with steady gait, without difficulty or distress noted; pt reports upper abd pain accomp by N/V this morning

## 2022-09-04 ENCOUNTER — Encounter: Payer: Self-pay | Admitting: Nurse Practitioner

## 2022-09-04 ENCOUNTER — Ambulatory Visit (INDEPENDENT_AMBULATORY_CARE_PROVIDER_SITE_OTHER): Payer: 59 | Admitting: Nurse Practitioner

## 2022-09-04 VITALS — BP 118/62 | HR 63 | Temp 98.1°F | Resp 16 | Ht 61.0 in | Wt 212.5 lb

## 2022-09-04 DIAGNOSIS — R1013 Epigastric pain: Secondary | ICD-10-CM

## 2022-09-04 DIAGNOSIS — R110 Nausea: Secondary | ICD-10-CM

## 2022-09-04 DIAGNOSIS — R112 Nausea with vomiting, unspecified: Secondary | ICD-10-CM | POA: Insufficient documentation

## 2022-09-04 DIAGNOSIS — R42 Dizziness and giddiness: Secondary | ICD-10-CM | POA: Diagnosis not present

## 2022-09-04 DIAGNOSIS — R519 Headache, unspecified: Secondary | ICD-10-CM | POA: Insufficient documentation

## 2022-09-04 DIAGNOSIS — Z09 Encounter for follow-up examination after completed treatment for conditions other than malignant neoplasm: Secondary | ICD-10-CM | POA: Insufficient documentation

## 2022-09-04 DIAGNOSIS — R1010 Upper abdominal pain, unspecified: Secondary | ICD-10-CM | POA: Insufficient documentation

## 2022-09-04 MED ORDER — METOCLOPRAMIDE HCL 5 MG PO TABS
5.0000 mg | ORAL_TABLET | Freq: Three times a day (TID) | ORAL | 0 refills | Status: DC | PRN
Start: 2022-09-04 — End: 2023-01-27

## 2022-09-04 MED ORDER — OMEPRAZOLE 20 MG PO CPDR: 20.0000 mg | DELAYED_RELEASE_CAPSULE | Freq: Every day | ORAL | 3 refills | Status: DC

## 2022-09-04 NOTE — Patient Instructions (Addendum)
Nice to see you today I have sent in the medications we discussed Follow up as scheduled [facility] if you need me   Warm compresses for the eye allegra for the itching. If it gets worse let me know

## 2022-09-04 NOTE — Assessment & Plan Note (Signed)
Patient still having nausea.  Zofran makes her sleepy we will discontinue Zofran and start patient on Reglan 5 mg every 8 as needed.  Also start with a bland/brat diet drink plenty of fluids

## 2022-09-04 NOTE — Assessment & Plan Note (Signed)
Tender to palpation with nausea and decreased appetite will do H. pylori breath test start on a PPI pending results

## 2022-09-04 NOTE — Assessment & Plan Note (Signed)
Encouraged hydration also encouraged trying to eat something if we get the nausea under control.

## 2022-09-04 NOTE — Assessment & Plan Note (Signed)
Likely secondary to poor appetite and decreased fluid intake.  Tylenol does help the headache.  Will give patient Reglan 5 mg every 8 as needed help with nausea and headache.  Encouraged hydration and food as tolerates.

## 2022-09-04 NOTE — Assessment & Plan Note (Signed)
Did review ED note along with ultrasound, CT scan, EKG, and blood work.

## 2022-09-04 NOTE — Progress Notes (Signed)
Acute Office Visit  Subjective:     Patient ID: Stacey Underwood, female    DOB: 1975/10/29, 47 y.o.   MRN: [id number]  Chief Complaint  Patient presents with   Hospitalization Follow-up   Dizziness   Headache   Nausea     Patient is in today for hospital follow up patient was seen in the hospital on 08/30/2022 for abdominal pain.  Patient underwent blood work and EKG.  Patient was given a liter of fluids along with IV Protonix and ZofranPatient did have an ultrasound of the right upper quadrant along with a CT abdomen pelvis with contrast.   States that she is still nauseated. States that she has not thrown up. States that she will take the zofran but that makes her sleepy.  It does help with the nausea  States that Sunday she was no better. Still taking the zofran. Decreased appetite. States that she was using tylenol for headaches.  States Monday she tried to push her self but felt dizzy/lightheaded with activity.   States that she is sipping on water through out the day. States that she has been doing chicken noodle soup and it makes her feel like she is going to throw up. She tired a peanut butter sandwich and did ok with it.  Headache is all the times. States that It is behind her eyes and the back of her head. She thinks it is tension and pressure points she can press that makes it go away. The pain level is 4-5 and tylenol does help.   Eye trouble: Sunday right eye that is some swelling itching and a little crust.  Patient is not on does not having any of the same symptoms  Review of Systems  Constitutional:  Positive for chills. Negative for fever.  Gastrointestinal:  Positive for abdominal pain and nausea. Negative for constipation, diarrhea and vomiting.       BM once since her hosptial stay that was loose  Genitourinary:  Negative for dysuria and frequency.  Neurological:  Positive for dizziness and headaches.        Objective:    BP 118/62   Pulse 63   Temp  98.1 F (36.7 C)   Resp 16   Ht 5\' 1"  (1.549 m)   Wt 212 lb 8 oz (96.4 kg)   LMP 08/30/2022 (Exact Date)   SpO2 98%   BMI 40.15 kg/m    Physical Exam Vitals and nursing note reviewed.  Constitutional:      Appearance: Normal appearance.  HENT:     Right Ear: Tympanic membrane, ear canal and external ear normal.     Left Ear: Tympanic membrane, ear canal and external ear normal.     Mouth/Throat:     Mouth: Mucous membranes are moist.     Pharynx: Oropharynx is clear.  Eyes:     Extraocular Movements: Extraocular movements intact.     Pupils: Pupils are equal, round, and reactive to light.  Cardiovascular:     Rate and Rhythm: Normal rate and regular rhythm.     Heart sounds: Normal heart sounds.  Pulmonary:     Effort: Pulmonary effort is normal.     Breath sounds: Normal breath sounds.  Abdominal:     General: Bowel sounds are normal. There is no distension.     Palpations: There is no mass.     Tenderness: There is abdominal tenderness.     Hernia: No hernia is present.  Musculoskeletal:  Right lower leg: No edema.     Left lower leg: No edema.  Lymphadenopathy:     Cervical: No cervical adenopathy.  Neurological:     General: No focal deficit present.     Mental Status: She is alert.     Deep Tendon Reflexes:     Reflex Scores:      Bicep reflexes are 2+ on the right side and 2+ on the left side.      Patellar reflexes are 2+ on the right side and 2+ on the left side.    Comments: Bilateral upper and lower extremity strength 5/5.     No results found for any visits on 09/04/22.      Assessment & Plan:   Problem List Items Addressed This Visit       Other   Lightheadedness    Encouraged hydration also encouraged trying to eat something if we get the nausea under control.      Acute intractable headache    Likely secondary to poor appetite and decreased fluid intake.  Tylenol does help the headache.  Will give patient Reglan 5 mg every 8 as needed  help with nausea and headache.  Encouraged hydration and food as tolerates.      Relevant Medications   metoCLOPramide (REGLAN) 5 MG tablet   Nausea    Patient still having nausea.  Zofran makes her sleepy we will discontinue Zofran and start patient on Reglan 5 mg every 8 as needed.  Also start with a bland/brat diet drink plenty of fluids      Relevant Medications   omeprazole (PRILOSEC) 20 MG capsule   metoCLOPramide (REGLAN) 5 MG tablet   Epigastric pain - Primary    Tender to palpation with nausea and decreased appetite will do H. pylori breath test start on a PPI pending results      Relevant Medications   omeprazole (PRILOSEC) 20 MG capsule   Other Relevant Orders   H. pylori breath test   Hospital discharge follow-up    Did review ED note along with ultrasound, CT scan, EKG, and blood work.       Meds ordered this encounter  Medications   omeprazole (PRILOSEC) 20 MG capsule    Sig: Take 1 capsule (20 mg total) by mouth daily.    Dispense:  30 capsule    Refill:  3    Order Specific Question:   Supervising Provider    Answer:   Roxy Manns A [[id number]]   metoCLOPramide (REGLAN) 5 MG tablet    Sig: Take 1 tablet (5 mg total) by mouth every 8 (eight) hours as needed for nausea.    Dispense:  20 tablet    Refill:  0    Order Specific Question:   Supervising Provider    Answer:   TOWER, MARNE A [[id number]]    Return if symptoms worsen or fail to improve.  Audria Nine, NP

## 2022-09-06 LAB — H. PYLORI BREATH TEST: H. pylori Breath Test: NOT DETECTED

## 2022-09-23 ENCOUNTER — Encounter: Payer: Self-pay | Admitting: Nurse Practitioner

## 2022-09-23 ENCOUNTER — Ambulatory Visit (INDEPENDENT_AMBULATORY_CARE_PROVIDER_SITE_OTHER): Payer: 59 | Admitting: Nurse Practitioner

## 2022-09-23 VITALS — BP 116/62 | HR 82 | Temp 98.1°F | Resp 16 | Ht 61.0 in | Wt 214.4 lb

## 2022-09-23 DIAGNOSIS — J301 Allergic rhinitis due to pollen: Secondary | ICD-10-CM | POA: Diagnosis not present

## 2022-09-23 DIAGNOSIS — Z0000 Encounter for general adult medical examination without abnormal findings: Secondary | ICD-10-CM | POA: Diagnosis not present

## 2022-09-23 DIAGNOSIS — R519 Headache, unspecified: Secondary | ICD-10-CM

## 2022-09-23 DIAGNOSIS — Z1211 Encounter for screening for malignant neoplasm of colon: Secondary | ICD-10-CM

## 2022-09-23 DIAGNOSIS — Z13220 Encounter for screening for lipoid disorders: Secondary | ICD-10-CM

## 2022-09-23 NOTE — Assessment & Plan Note (Signed)
Discussed age-appropriate immunizations and screening exams.  Did review patient's personal, surgical, social, family history.  Patient is up-to-date on all age-appropriate immunizations that she would like.  Ambulatory referral to GI for CRC screening.  Patient is followed by GYN and per patient report up-to-date on cervical cancer and breast cancer screenings.  Patient was given information at discharge about preventative healthcare maintenance with anticipatory guidance.

## 2022-09-23 NOTE — Assessment & Plan Note (Signed)
Pending TSH lipid panel and A1c.

## 2022-09-23 NOTE — Patient Instructions (Signed)
Nice to see you today Make a fasting labs appointment sometime over the next 2 weeks  Give the omeprazole 2-3 more weeks if the stomach is not improving let me know You can also consider a muscle relaxer.  Follow up with me in 1 year for your next physical and labs, sooner if you need me

## 2022-09-23 NOTE — Assessment & Plan Note (Signed)
Patient currently maintained on fluticasone and fexofenadine on a as needed basis.  Continue

## 2022-09-23 NOTE — Progress Notes (Signed)
Established Patient Office Visit  Subjective   Patient ID: Stacey Underwood, female    DOB: Dec 20, 1975  Age: 47 y.o. MRN: [id number]  Chief Complaint  Patient presents with   Establish Care    HPI  OCP: Dr Chestine Spore at [facility]. States pap semear and mammogram  earlier this year   Allergies: states that she takes allergra as needed along with the flonase   for complete physical and follow up of chronic conditions.  Immunizations: -Tetanus: Completed in 2023 -Influenza: out of season  -Shingles: Too young -Pneumonia: Too young  Diet: Fair diet. States that she will do one meal a day since being sick. States prior to being sick she would eat lunch and dinner. Water and doing some coffee Exercise: No regular exercise. Employment and walks a lot. On her lunch break she will walk at the park . Yard work on the weekends. Small weights   Eye exam:  prescription readers. Once a year. States blurry vision that is intermittent and floaters  Dental exam: Completes semi-annually    Colonoscopy: Ambulatory referral for [place]  Lung Cancer Screening: N/A  Pap smear: Dr. Chestine Spore with OB/GYN per patient report completed earlier this year  Mammogram: Dr. Chestine Spore with OB/GYN, per patient report completed earlier this year  DEXA: Too young  Hosptial follow: States that she can tell that if she misses the omeprazole daily that has help. States that she has taken the metoclopramide has helped. States that she will take it as needed.   Headaches. Right behind the right eye almost daily.  States that it is described as a throbbing pain. Statea that sometime she will wake up with them. Mot of the time it will come on after work. States that she will take medicine. She will try tylenol if that does not help allergia. States over the weekend she took 4 tylenol and 2 aleves.        ROS    Objective:     BP 116/62   Pulse 82   Temp 98.1 F (36.7 C)   Resp 16   Ht 5\' 1"  (1.549 m)   Wt  214 lb 6 oz (97.2 kg)   LMP 08/30/2022 (Exact Date)   SpO2 98%   BMI 40.51 kg/m    Physical Exam Vitals and nursing note reviewed.  Constitutional:      Appearance: Normal appearance.  HENT:     Right Ear: Tympanic membrane, ear canal and external ear normal.     Left Ear: Tympanic membrane, ear canal and external ear normal.     Mouth/Throat:     Mouth: Mucous membranes are moist.     Pharynx: Oropharynx is clear.  Eyes:     Extraocular Movements: Extraocular movements intact.     Pupils: Pupils are equal, round, and reactive to light.  Cardiovascular:     Rate and Rhythm: Normal rate and regular rhythm.     Pulses: Normal pulses.     Heart sounds: Normal heart sounds.  Pulmonary:     Effort: Pulmonary effort is normal.     Breath sounds: Normal breath sounds.  Abdominal:     General: Bowel sounds are normal. There is no distension.     Palpations: There is no mass.     Tenderness: There is no abdominal tenderness.     Hernia: No hernia is present.  Musculoskeletal:       Arms:     Right lower leg: No edema.  Left lower leg: No edema.     Comments: Muscle tightness on palpation  Lymphadenopathy:     Cervical: No cervical adenopathy.  Skin:    General: Skin is warm.  Neurological:     General: No focal deficit present.     Mental Status: She is alert.     Deep Tendon Reflexes:     Reflex Scores:      Bicep reflexes are 2+ on the right side and 2+ on the left side.      Patellar reflexes are 2+ on the right side and 2+ on the left side.    Comments: Bilateral upper and lower extremity strength 5/5  Psychiatric:        Mood and Affect: Mood normal.        Behavior: Behavior normal.        Thought Content: Thought content normal.        Judgment: Judgment normal.      No results found for any visits on 09/23/22.    The 10-year ASCVD risk score (Arnett DK, et al., 2019) is: 0.8%    Assessment & Plan:   Problem List Items Addressed This Visit        Respiratory   Allergic rhinitis    Patient currently maintained on fluticasone and fexofenadine on a as needed basis.  Continue        Other   Preventative health care - Primary    Discussed age-appropriate immunizations and screening exams.  Did review patient's personal, surgical, social, family history.  Patient is up-to-date on all age-appropriate immunizations that she would like.  Ambulatory referral to GI for CRC screening.  Patient is followed by GYN and per patient report up-to-date on cervical cancer and breast cancer screenings.  Patient was given information at discharge about preventative healthcare maintenance with anticipatory guidance.      Relevant Orders   TSH   CBC   Comprehensive metabolic panel   Frequent headaches    Patient did take the Reglan helped some.  Patient has right-sided behind the eye headache almost daily.  Query medication overuse headache versus atypical migraine.  Patient does have some tension of the muscles.  She has signed up to get a massage in the near future.  Offered to do muscle relaxer patient politely declined at this juncture.  If having continued headaches after massage consider muscle relaxer if still having continued headaches consider referral to neurology      Morbid obesity (HCC)    Pending TSH lipid panel and A1c.      Relevant Orders   Hemoglobin A1c   Other Visit Diagnoses     Screening for colon cancer       Relevant Orders   Ambulatory referral to Gastroenterology   Screening for lipid disorders       Relevant Orders   Lipid panel       Return in about 1 year (around 09/23/2023), or if symptoms worsen or fail to improve, for CPE and Labs.    Audria Nine, NP

## 2022-09-23 NOTE — Assessment & Plan Note (Signed)
Patient did take the Reglan helped some.  Patient has right-sided behind the eye headache almost daily.  Query medication overuse headache versus atypical migraine.  Patient does have some tension of the muscles.  She has signed up to get a massage in the near future.  Offered to do muscle relaxer patient politely declined at this juncture.  If having continued headaches after massage consider muscle relaxer if still having continued headaches consider referral to neurology

## 2022-09-26 ENCOUNTER — Other Ambulatory Visit (INDEPENDENT_AMBULATORY_CARE_PROVIDER_SITE_OTHER): Payer: 59

## 2022-09-26 DIAGNOSIS — Z13220 Encounter for screening for lipoid disorders: Secondary | ICD-10-CM | POA: Diagnosis not present

## 2022-09-26 DIAGNOSIS — Z0000 Encounter for general adult medical examination without abnormal findings: Secondary | ICD-10-CM | POA: Diagnosis not present

## 2022-09-26 LAB — COMPREHENSIVE METABOLIC PANEL
ALT: 18 U/L (ref 0–35)
AST: 16 U/L (ref 0–37)
Albumin: 4.1 g/dL (ref 3.5–5.2)
Alkaline Phosphatase: 66 U/L (ref 39–117)
BUN: 12 mg/dL (ref 6–23)
CO2: 27 mEq/L (ref 19–32)
Calcium: 9.1 mg/dL (ref 8.4–10.5)
Chloride: 105 mEq/L (ref 96–112)
Creatinine, Ser: 0.66 mg/dL (ref 0.40–1.20)
GFR: 104.86 mL/min (ref >60.00)
Glucose, Bld: 107 mg/dL — ABNORMAL HIGH (ref 70–99)
Potassium: 3.5 mEq/L (ref 3.5–5.1)
Sodium: 140 mEq/L (ref 135–145)
Total Bilirubin: 0.4 mg/dL (ref 0.2–1.2)
Total Protein: 7.2 g/dL (ref 6.0–8.3)

## 2022-09-26 LAB — LIPID PANEL
Cholesterol: 197 mg/dL (ref 0–200)
HDL: 46.5 mg/dL (ref >39.00)
LDL Cholesterol: 132 mg/dL — ABNORMAL HIGH (ref 0–99)
NonHDL: 150.99
Total CHOL/HDL Ratio: 4
Triglycerides: 93 mg/dL (ref 0.0–149.0)
VLDL: 18.6 mg/dL (ref 0.0–40.0)

## 2022-09-26 LAB — CBC
HCT: 35 % — ABNORMAL LOW (ref 36.0–46.0)
Hemoglobin: 12 g/dL (ref 12.0–15.0)
MCHC: 34.3 g/dL (ref 30.0–36.0)
MCV: 85.8 fl (ref 78.0–100.0)
Platelets: 387 10*3/uL (ref 150.0–400.0)
RBC: 4.08 Mil/uL (ref 3.87–5.11)
RDW: 12.9 % (ref 11.5–15.5)
WBC: 6.7 10*3/uL (ref 4.0–10.5)

## 2022-09-26 LAB — HEMOGLOBIN A1C: Hgb A1c MFr Bld: 5.4 % (ref 4.6–6.5)

## 2022-09-26 LAB — TSH: TSH: 1.56 u[IU]/mL (ref 0.35–5.50)

## 2022-10-02 ENCOUNTER — Encounter: Payer: Self-pay | Admitting: *Deleted

## 2022-10-17 ENCOUNTER — Ambulatory Visit: Payer: Medicaid Other | Admitting: Family Medicine

## 2023-01-08 ENCOUNTER — Telehealth: Payer: Self-pay

## 2023-01-08 NOTE — Telephone Encounter (Signed)
Message left for patient to return my call.  

## 2023-01-08 NOTE — Telephone Encounter (Signed)
Pt returning your call to schedule her colonoscopy. Please return call.

## 2023-01-09 NOTE — Telephone Encounter (Signed)
Patient called back and she had certain days, she wanted to schedule when she is in [place]. She helping out family in [place].

## 2023-01-27 ENCOUNTER — Ambulatory Visit: Payer: 59 | Admitting: Nurse Practitioner

## 2023-01-27 ENCOUNTER — Encounter: Payer: Self-pay | Admitting: Nurse Practitioner

## 2023-01-27 VITALS — BP 112/78 | HR 74 | Temp 98.2°F | Ht 61.0 in | Wt 214.6 lb

## 2023-01-27 DIAGNOSIS — M79672 Pain in left foot: Secondary | ICD-10-CM | POA: Diagnosis not present

## 2023-01-27 DIAGNOSIS — Z6841 Body Mass Index (BMI) 40.0 and over, adult: Secondary | ICD-10-CM | POA: Diagnosis not present

## 2023-01-27 DIAGNOSIS — Z01419 Encounter for gynecological examination (general) (routine) without abnormal findings: Secondary | ICD-10-CM | POA: Diagnosis not present

## 2023-01-27 DIAGNOSIS — Z1231 Encounter for screening mammogram for malignant neoplasm of breast: Secondary | ICD-10-CM | POA: Diagnosis not present

## 2023-01-27 DIAGNOSIS — L989 Disorder of the skin and subcutaneous tissue, unspecified: Secondary | ICD-10-CM

## 2023-01-27 MED ORDER — MELOXICAM 15 MG PO TABS
15.0000 mg | ORAL_TABLET | Freq: Every day | ORAL | 0 refills | Status: AC
Start: 2023-01-27 — End: ?

## 2023-01-27 NOTE — Assessment & Plan Note (Signed)
Seems most likely plantar fasciitis.  Meloxicam 15 Mg daily for 7 to 10 days.  After that she can do as needed take with food avoid other NSAIDs.  Patient is already gotten good supportive footwear we will give her a rehab exercises to do at home to continue using the heating pad as needed

## 2023-01-27 NOTE — Progress Notes (Signed)
Acute Office Visit  Subjective:     Patient ID: Stacey Underwood, female    DOB: 1976-01-26, 47 y.o.   MRN: [id number]  Chief Complaint  Patient presents with   Cyst    Pt complains of hard knot on left side of middle of back. Noticed 6 months ago.    Foot Pain    Pt complains a month ago her left heel started to hurt. Pt states that she has tried to change the type of shoes but no change in pain. Sharp pain and tingling especially when standing.      Patient is in today for multiple complaints with a history of PVC, MDD, hld, obesity, and anemia  Skin lesion: left side on her back. States that it has been there for several months. States that even before she saw me. Staets that it has not changed and does not hurt. No drainage or discharge. Along the the bra line. She noticed it because she was putting on her bra  Foot pain: states it started approx 1-1.5 months ago. States that it is the left foot. States that has good shoes and buys brooks that have good support. She does work on her feet a lot. Has tried hokas, sketchers, and brooks. She has tried inserts. States that the heel hurts with or without pressure. States that she has tried a heating pad that has helped. States that it does not get better with movement. Does hurt at rest. Has tried tylenol that helps some and has tried advil   Obesity: states that she is eating 1-2 meals a day with few snacks. Drinks water. 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 States that she has went to a diet clinic once. States that she has used phenteramine  Review of Systems  Constitutional:  Negative for chills and fever.  Respiratory:  Negative for shortness of breath.   Cardiovascular:  Negative for chest pain.  Musculoskeletal:  Positive for joint pain.  Neurological:  Positive for tingling. Negative for headaches.        Objective:    BP 112/78   Pulse 74   Temp 98.2 F (36.8 C) (Oral)   Ht 5\' 1"  (1.549 m)   Wt  214 lb 9.6 oz (97.3 kg)   SpO2 96%   BMI 40.55 kg/m  BP Readings from Last 3 Encounters:  01/27/23 112/78  09/23/22 116/62  09/04/22 118/62   Wt Readings from Last 3 Encounters:  01/27/23 214 lb 9.6 oz (97.3 kg)  09/23/22 214 lb 6 oz (97.2 kg)  09/04/22 212 lb 8 oz (96.4 kg)   SpO2 Readings from Last 3 Encounters:  01/27/23 96%  09/23/22 98%  09/04/22 98%      Physical Exam Vitals and nursing note reviewed.  Constitutional:      Appearance: Normal appearance.  Cardiovascular:     Rate and Rhythm: Normal rate and regular rhythm.     Pulses:          Dorsalis pedis pulses are 2+ on the right side and 2+ on the left side.       Posterior tibial pulses are 2+ on the right side and 2+ on the left side.     Heart sounds: Normal heart sounds.  Pulmonary:     Effort: Pulmonary effort is normal.     Breath sounds: Normal breath sounds.  Musculoskeletal:       Feet:  Skin:    General: Skin  is warm.     Capillary Refill: Capillary refill takes less than 2 seconds.       Neurological:     Mental Status: She is alert.     No results found for any visits on 01/27/23.      Assessment & Plan:   Problem List Items Addressed This Visit       Musculoskeletal and Integument   Skin lesion    Benign thickening/cyst.  Watchful waiting no red flags in office        Other   Morbid obesity (HCC)    Patient tried phentermine in the past before without great ligament.  Did write down for his appetite and semaglutide she will call and see which is a preferred agent for obesity under her insurance. She can message via MyChart or call the office.      Pain of left heel - Primary    Seems most likely plantar fasciitis.  Meloxicam 15 Mg daily for 7 to 10 days.  After that she can do as needed take with food avoid other NSAIDs.  Patient is already gotten good supportive footwear we will give her a rehab exercises to do at home to continue using the heating pad as needed        Meds ordered this encounter  Medications   meloxicam (MOBIC) 15 MG tablet    Sig: Take 1 tablet (15 mg total) by mouth daily.    Dispense:  30 tablet    Refill:  0    Order Specific Question:   Supervising Provider    Answer:   TOWER, MARNE A [[id number]    Return if symptoms worsen or fail to improve.  Audria Nine, NP

## 2023-01-27 NOTE — Patient Instructions (Signed)
Nice to see you today The injectable weight loss medications are Wegovy and Zepbound Follow up if you do not improve

## 2023-01-27 NOTE — Assessment & Plan Note (Signed)
Patient tried phentermine in the past before without great ligament.  Did write down for his appetite and semaglutide she will call and see which is a preferred agent for obesity under her insurance. She can message via MyChart or call the office.

## 2023-01-27 NOTE — Assessment & Plan Note (Signed)
Benign thickening/cyst.  Watchful waiting no red flags in office

## 2023-01-30 ENCOUNTER — Ambulatory Visit
Admission: RE | Admit: 2023-01-30 | Discharge: 2023-01-30 | Disposition: A | Discharge status: Home / Self Care | Payer: 59 | Source: Ambulatory Visit | Attending: Obstetrics | Admitting: Obstetrics

## 2023-01-30 ENCOUNTER — Other Ambulatory Visit: Payer: Self-pay | Admitting: Obstetrics

## 2023-01-30 DIAGNOSIS — R928 Other abnormal and inconclusive findings on diagnostic imaging of breast: Secondary | ICD-10-CM

## 2023-01-31 ENCOUNTER — Other Ambulatory Visit: Payer: Self-pay

## 2023-01-31 ENCOUNTER — Telehealth: Payer: Self-pay

## 2023-01-31 DIAGNOSIS — Z1211 Encounter for screening for malignant neoplasm of colon: Secondary | ICD-10-CM

## 2023-01-31 MED ORDER — NA SULFATE-K SULFATE-MG SULF 17.5-3.13-1.6 GM/177ML PO SOLN: 1.0000 | Freq: Once | ORAL | 0 refills | Status: AC

## 2023-01-31 NOTE — Telephone Encounter (Signed)
Gastroenterology Pre-Procedure Review  Request Date: 03/05/23 Requesting Physician: Dr. Tobi Bastos  PATIENT REVIEW QUESTIONS: The patient responded to the following health history questions as indicated:    1. Are you having any GI issues? no 2. Do you have a personal history of Polyps? no 3. Do you have a family history of Colon Cancer or Polyps? yes (maternal grandmother colon cancer) 4. Diabetes Mellitus? no 5. Joint replacements in the past 12 months?no 6. Major health problems in the past 3 months?no 7. Any artificial heart valves, MVP, or defibrillator?no    MEDICATIONS & ALLERGIES:    Patient reports the following regarding taking any anticoagulation/antiplatelet therapy:   Plavix, Coumadin, Eliquis, Xarelto, Lovenox, Pradaxa, Brilinta, or Effient? no Aspirin? no  Patient confirms/reports the following medications:  Current Outpatient Medications  Medication Sig Dispense Refill   augmented betamethasone dipropionate (DIPROLENE-AF) 0.05 % ointment APPLY TO AFFECTED AREA EXTERNALLY TWICE A DAY (Patient not taking: Reported on 01/27/2023)     fexofenadine-pseudoephedrine (ALLEGRA-D) 60-120 MG 12 hr tablet Take 1 tablet by mouth 2 (two) times daily. (Patient not taking: Reported on 01/27/2023) 20 tablet 0   fluticasone (FLONASE) 50 MCG/ACT nasal spray Place 2 sprays into both nostrils daily. (Patient not taking: Reported on 01/27/2023) 16 g 6   MAXITROL 3.5-10000-0.1 OINT SMARTSIG:1 Sparingly In Eye(s) 4 Times Daily (Patient not taking: Reported on 01/27/2023)     meloxicam (MOBIC) 15 MG tablet Take 1 tablet (15 mg total) by mouth daily. 30 tablet 0   montelukast (SINGULAIR) 10 MG tablet Take 1 tablet (10 mg total) by mouth at bedtime. (Patient not taking: Reported on 01/27/2023) 90 tablet 3   norethindrone (MICRONOR) 0.35 MG tablet Take 1 tablet by mouth daily. (Patient not taking: Reported on 01/27/2023)     omeprazole (PRILOSEC) 20 MG capsule Take 1 capsule (20 mg total) by mouth daily.  30 capsule 3   TOBRADEX ophthalmic ointment SMARTSIG:1 sparingly Topical 3 Times Daily (Patient not taking: Reported on 01/27/2023)     No current facility-administered medications for this visit.    Patient confirms/reports the following allergies:  No Active Allergies  No orders of the defined types were placed in this encounter.   AUTHORIZATION INFORMATION Primary Insurance: 1D#: Group #:  Secondary Insurance: 1D#: Group #:  SCHEDULE INFORMATION: Date: 03/05/23 Time: Location: [facility]

## 2023-02-18 ENCOUNTER — Other Ambulatory Visit: Payer: Self-pay | Admitting: Nurse Practitioner

## 2023-02-26 ENCOUNTER — Encounter: Payer: Self-pay | Admitting: Gastroenterology

## 2023-03-05 ENCOUNTER — Ambulatory Visit: Payer: 59

## 2023-03-05 ENCOUNTER — Encounter: Payer: Self-pay | Admitting: Gastroenterology

## 2023-03-05 ENCOUNTER — Encounter
Admission: RE | Disposition: A | Discharge status: Home / Self Care | Payer: Self-pay | Source: Home / Self Care | Attending: Gastroenterology

## 2023-03-05 ENCOUNTER — Ambulatory Visit
Admission: RE | Admit: 2023-03-05 | Discharge: 2023-03-05 | Disposition: A | Discharge status: Home / Self Care | Payer: 59 | Attending: Gastroenterology | Admitting: Gastroenterology

## 2023-03-05 ENCOUNTER — Other Ambulatory Visit: Payer: Self-pay

## 2023-03-05 DIAGNOSIS — Z8249 Family history of ischemic heart disease and other diseases of the circulatory system: Secondary | ICD-10-CM | POA: Insufficient documentation

## 2023-03-05 DIAGNOSIS — Z1211 Encounter for screening for malignant neoplasm of colon: Secondary | ICD-10-CM | POA: Insufficient documentation

## 2023-03-05 DIAGNOSIS — Z8673 Personal history of transient ischemic attack (TIA), and cerebral infarction without residual deficits: Secondary | ICD-10-CM | POA: Diagnosis not present

## 2023-03-05 DIAGNOSIS — K219 Gastro-esophageal reflux disease without esophagitis: Secondary | ICD-10-CM | POA: Diagnosis not present

## 2023-03-05 DIAGNOSIS — Z0000 Encounter for general adult medical examination without abnormal findings: Secondary | ICD-10-CM

## 2023-03-05 DIAGNOSIS — K5730 Diverticulosis of large intestine without perforation or abscess without bleeding: Secondary | ICD-10-CM | POA: Insufficient documentation

## 2023-03-05 HISTORY — PX: COLONOSCOPY WITH PROPOFOL: SHX5780

## 2023-03-05 LAB — POCT PREGNANCY, URINE: Preg Test, Ur: NEGATIVE

## 2023-03-05 SURGERY — COLONOSCOPY WITH PROPOFOL
Anesthesia: General

## 2023-03-05 MED ORDER — SODIUM CHLORIDE 0.9 % IV SOLN: INTRAVENOUS | Status: DC

## 2023-03-05 MED ORDER — PROPOFOL 500 MG/50ML IV EMUL
INTRAVENOUS | Status: DC | PRN
  Administered 2023-03-05: 100 mg via INTRAVENOUS
  Administered 2023-03-05: 200 ug/kg/min via INTRAVENOUS

## 2023-03-05 MED ORDER — LIDOCAINE HCL (PF) 2 % IJ SOLN
INTRAMUSCULAR | Status: AC
  Filled 2023-03-05: qty 5

## 2023-03-05 MED ORDER — PROPOFOL 1000 MG/100ML IV EMUL
INTRAVENOUS | Status: AC
  Filled 2023-03-05: qty 100

## 2023-03-05 NOTE — Op Note (Signed)
[facility] Gastroenterology Patient Name: Stacey Underwood Procedure Date: 03/05/2023 7:56 AM MRN: [id number] Account #: [id number] Date of Birth: 01/09/1976 Admit Type: Outpatient Age: 47 Room: [facility] ENDO ROOM 3 Gender: Female Note Status: Finalized Instrument Name: Prentice Docker [id number] Procedure:             Colonoscopy Indications:           Screening for colorectal malignant neoplasm Providers:             Wyline Mood MD, MD Referring MD:          Genene Churn. Toney Reil (Referring MD) Medicines:             Monitored Anesthesia Care Complications:         No immediate complications. Procedure:             Pre-Anesthesia Assessment:                        - Prior to the procedure, a History and Physical was                         performed, and patient medications, allergies and                         sensitivities were reviewed. The patient's tolerance                         of previous anesthesia was reviewed.                        - The risks and benefits of the procedure and the                         sedation options and risks were discussed with the                         patient. All questions were answered and informed                         consent was obtained.                        - ASA Grade Assessment: II - A patient with mild                         systemic disease.                        After obtaining informed consent, the colonoscope was                         passed under direct vision. Throughout the procedure,                         the patient's blood pressure, pulse, and oxygen                         saturations were monitored continuously. The                         Colonoscope was introduced  through the anus and                         advanced to the the cecum, identified by the                         appendiceal orifice. The colonoscopy was performed                         with ease. The patient tolerated the procedure well.                          The quality of the bowel preparation was excellent.                         Anatomical landmarks were photographed. Findings:      The perianal and digital rectal examinations were normal.      The entire examined colon appeared normal on direct and retroflexion       views.      Multiple medium-mouthed diverticula were found in the sigmoid colon. Impression:            - The entire examined colon is normal on direct and                         retroflexion views.                        - No specimens collected. Recommendation:        - Discharge patient to home (with escort).                        - Resume previous diet.                        - Continue present medications.                        - Repeat colonoscopy in 10 years for screening                         purposes. Procedure Code(s):     --- Professional ---                        [phone], Colonoscopy, flexible; diagnostic, including                         collection of specimen(s) by brushing or washing, when                         performed (separate procedure) Diagnosis Code(s):     --- Professional ---                        Z12.11, Encounter for screening for malignant neoplasm                         of colon CPT copyright 2022 American Medical Association. All rights reserved. The codes documented in this report are preliminary and upon coder review may  be revised to meet current compliance requirements.  Wyline Mood, MD Wyline Mood MD, MD 03/05/2023 8:17:29 AM This report has been signed electronically. Number of Addenda: 0 Note Initiated On: 03/05/2023 7:56 AM Scope Withdrawal Time: 0 hours 8 minutes 38 seconds  Total Procedure Duration: 0 hours 10 minutes 38 seconds  Estimated Blood Loss:  Estimated blood loss: none.      [facility]

## 2023-03-05 NOTE — Anesthesia Preprocedure Evaluation (Signed)
Anesthesia Evaluation  Patient identified by MRN, date of birth, ID band Patient awake    Reviewed: Allergy & Precautions, NPO status , Patient's Chart, lab work & pertinent test results  Airway Mallampati: II  TM Distance: >3 FB Neck ROM: full    Dental  (+) Teeth Intact   Pulmonary neg pulmonary ROS   Pulmonary exam normal        Cardiovascular Exercise Tolerance: Good negative cardio ROS Normal cardiovascular exam Rhythm:Regular Rate:Normal     Neuro/Psych   Anxiety     TIAnegative neurological ROS  negative psych ROS   GI/Hepatic negative GI ROS, Neg liver ROS,GERD  Medicated,,  Endo/Other  negative endocrine ROS    Renal/GU negative Renal ROS  negative genitourinary   Musculoskeletal   Abdominal Normal abdominal exam  (+)   Peds negative pediatric ROS (+)  Hematology negative hematology ROS (+)   Anesthesia Other Findings Past Medical History: No date: Anemia     Comment:  H/O LAST HGB ON 10-20-15 WAS 12.1 No date: Family history of adverse reaction to anesthesia     Comment:  PTS GRANDMA N/V No date: GERD (gastroesophageal reflux disease) No date: Headache     Comment:  H/O MIGRAINES No date: TIA (transient ischemic attack)  Past Surgical History: 04/2014: CARPAL TUNNEL RELEASE; Bilateral No date: COLONOSCOPY WITH ESOPHAGOGASTRODUODENOSCOPY (EGD) 01/04/2016: DIRECT LARYNGOSCOPY; Right     Comment:  Procedure: DIRECT LARYNGOSCOPY WITH MICRO FLAP EXCISION;              Surgeon: Bud Face, MD;  Location: [facility] ORS;                Service: ENT;  Laterality: Right; No date: ROTATOR CUFF REPAIR; Left  BMI    Body Mass Index: 39.11 kg/m      Reproductive/Obstetrics negative OB ROS                             Anesthesia Physical Anesthesia Plan  ASA: 2  Anesthesia Plan: General   Post-op Pain Management:    Induction: Intravenous  PONV Risk Score and Plan:  Propofol infusion and TIVA  Airway Management Planned: Natural Airway and Nasal Cannula  Additional Equipment:   Intra-op Plan:   Post-operative Plan:   Informed Consent: I have reviewed the patients History and Physical, chart, labs and discussed the procedure including the risks, benefits and alternatives for the proposed anesthesia with the patient or authorized representative who has indicated his/her understanding and acceptance.     Dental Advisory Given  Plan Discussed with: CRNA and Surgeon  Anesthesia Plan Comments:        Anesthesia Quick Evaluation

## 2023-03-05 NOTE — Addendum Note (Signed)
Addendum  created 03/05/23 1033 by Lysbeth Penner, CRNA   Flowsheet accepted

## 2023-03-05 NOTE — H&P (Signed)
Wyline Mood, MD [address], [place], [place], [address] [address], [address], [place], [place], [address] Phone: [phone]  Fax: [phone]  Primary Care Physician:  Eden Emms, NP   Pre-Procedure History & Physical: HPI:  Stacey Underwood is a 47 y.o. female is here for an colonoscopy.   Past Medical History:  Diagnosis Date   Anemia    H/O LAST HGB ON 10-20-15 WAS 12.1   Family history of adverse reaction to anesthesia    PTS GRANDMA N/V   GERD (gastroesophageal reflux disease)    Headache    H/O MIGRAINES   TIA (transient ischemic attack)     Past Surgical History:  Procedure Laterality Date   CARPAL TUNNEL RELEASE Bilateral 04/2014   COLONOSCOPY WITH ESOPHAGOGASTRODUODENOSCOPY (EGD)     DIRECT LARYNGOSCOPY Right 01/04/2016   Procedure: DIRECT LARYNGOSCOPY WITH MICRO FLAP EXCISION;  Surgeon: Bud Face, MD;  Location: [facility] ORS;  Service: ENT;  Laterality: Right;   ROTATOR CUFF REPAIR Left     Prior to Admission medications   Medication Sig Start Date End Date Taking? Authorizing Provider  Semaglutide-Weight Management 0.25 MG/0.5ML SOAJ Inject 0.25 mg into the skin once a week.   Yes [provider]  SLYND 4 MG TABS Take 1 tablet by mouth daily. 01/27/23  Yes [provider]  augmented betamethasone dipropionate (DIPROLENE-AF) 0.05 % ointment APPLY TO AFFECTED AREA EXTERNALLY TWICE A DAY Patient not taking: Reported on 01/27/2023    [provider]  fexofenadine-pseudoephedrine (ALLEGRA-D) 60-120 MG 12 hr tablet Take 1 tablet by mouth 2 (two) times daily. Patient not taking: Reported on 01/27/2023 01/29/19   Joni Reining, PA-C  fluticasone [facility]) 50 MCG/ACT nasal spray Place 2 sprays into both nostrils daily. Patient not taking: Reported on 01/27/2023 07/03/18   Trey Sailors, PA-C  MAXITROL 3.5-10000-0.1 OINT SMARTSIG:1 Sparingly In [place]) 4 Times Daily Patient not taking: Reported on 01/27/2023  10/16/22   [provider]  meloxicam (MOBIC) 15 MG tablet Take 1 tablet (15 mg total) by mouth daily. Patient not taking: Reported on 01/31/2023 01/27/23   Eden Emms, NP  montelukast (SINGULAIR) 10 MG tablet Take 1 tablet (10 mg total) by mouth at bedtime. Patient not taking: Reported on 01/27/2023 07/03/18   Trey Sailors, PA-C  norethindrone (MICRONOR) 0.35 MG tablet Take 1 tablet by mouth daily. Patient not taking: Reported on 01/27/2023 01/12/23   [provider]  omeprazole (PRILOSEC) 20 MG capsule Take 1 capsule (20 mg total) by mouth daily. 09/04/22   Eden Emms, NP  ondansetron (ZOFRAN-ODT) 4 MG disintegrating tablet TAKE 1 TABLET BY MOUTH EVERY 8 HOURS AS NEEDED FOR NAUSEA AND VOMITING Patient not taking: Reported on 01/31/2023    [provider]  TOBRADEX ophthalmic ointment SMARTSIG:1 sparingly Topical 3 Times Daily Patient not taking: Reported on 01/27/2023 10/16/22   [provider]    Allergies as of 01/31/2023   (No Known Allergies)    Family History  Problem Relation Age of Onset   Hyperlipidemia Mother    Healthy Mother    Healthy Father    Healthy Brother    Cancer Maternal Grandmother    Heart disease Maternal Grandmother    Diabetes Maternal Grandmother    Cancer Maternal Grandfather    Dementia Maternal Grandfather    Stroke Paternal Grandmother    Heart disease Paternal Grandmother    Diabetes Paternal Grandmother    CAD Paternal Grandmother 62  Had CABG, postop acute renal failure.  Died from complications.    Social History   Socioeconomic History   Marital status: Significant Other    Spouse name: Not on file   Number of children: Not on file   Years of education: Not on file   Highest education level: Some college, no degree  Occupational History   Not on file  Tobacco Use   Smoking status: Never   Smokeless tobacco: Never  Vaping Use   Vaping status: Never Used  Substance and Sexual  Activity   Alcohol use: Not Currently    Comment: RARE   Drug use: No   Sexual activity: Yes    Birth control/protection: Pill  Other Topics Concern   Not on file  Social History Narrative   She is currently' she has been widowed now for about 3 years from her second husband.   She has 2 children, 1 from her first husband who has thrown up in his living separately.  She then has a daughter from her second husband who lives with her still.  She is therefore a single mother.   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  She therefore is no longer working and is living off of Tree surgeon and her husband's life Brewing technologist.   She does not exercise much because she is "busy".   Social Determinants of Health   Financial Resource Strain: Low Risk  (01/23/2023)   Overall Financial Resource Strain (CARDIA)    Difficulty of Paying Living Expenses: Not very hard  Food Insecurity: Food Insecurity Present (01/23/2023)   Hunger Vital Sign    Worried About Running Out of Food in the Last Year: Sometimes true    Ran Out of Food in the Last Year: Never true  Transportation Needs: No Transportation Needs (01/23/2023)   PRAPARE - Administrator, Civil Service (Medical): No    Lack of Transportation (Non-Medical): No  Physical Activity: Insufficiently Active (01/23/2023)   Exercise Vital Sign    Days of Exercise per Week: 2 days    Minutes of Exercise per Session: 20 min  Stress: No Stress Concern Present (01/23/2023)   [employer] of Occupational Health - Occupational Stress Questionnaire    Feeling of Stress : Not at all  Social Connections: Moderately Integrated (01/23/2023)   Social Connection and Isolation Panel [NHANES]    Frequency of Communication with Friends and Family: More than three times a week     Frequency of Social Gatherings with Friends and Family: More than three times a week    Attends Religious Services: 1 to 4 times per year    Active Member of [employer] or Organizations: No    Attends Engineer, structural: Not on file    Marital Status: Living with partner  Intimate Partner Violence: Not on file    Review of Systems: See HPI, otherwise negative ROS  Physical Exam: BP 126/66   Pulse 66   Temp (!) 97.4 F (36.3 C) (Temporal)   Resp 18   Ht 5\' 1"  (1.549 m)   Wt 93.9 kg   SpO2 100%   BMI 39.11 kg/m  General:   Alert,  pleasant and cooperative in NAD Head:  Normocephalic and atraumatic. Neck:  Supple; no masses or thyromegaly. Lungs:  Clear throughout to auscultation,  normal respiratory effort.    Heart:  +S1, +S2, Regular rate and rhythm, No edema. Abdomen:  Soft, nontender and nondistended. Normal bowel sounds, without guarding, and without rebound.   Neurologic:  Alert and  oriented x4;  grossly normal neurologically.  Impression/Plan: Stacey Underwood is here for an colonoscopy to be performed for Screening colonoscopy average risk   Risks, benefits, limitations, and alternatives regarding  colonoscopy have been reviewed with the patient.  Questions have been answered.  All parties agreeable.   Wyline Mood, MD  03/05/2023, 7:45 AM

## 2023-03-05 NOTE — Transfer of Care (Signed)
Immediate Anesthesia Transfer of Care Note  Patient: Stacey Underwood  Procedure(s) Performed: COLONOSCOPY WITH PROPOFOL  Patient Location: PACU  Anesthesia Type:General  Level of Consciousness: drowsy  Airway & Oxygen Therapy: Patient Spontanous Breathing  Post-op Assessment: Report given to RN and Post -op Vital signs reviewed and stable  Post vital signs: Reviewed and stable  Last Vitals:  Vitals Value Taken Time  BP 114/67 03/05/23 0818  Temp    Pulse 84 03/05/23 0819  Resp 15 03/05/23 0819  SpO2 95 % 03/05/23 0819  Vitals shown include unfiled device data.  Last Pain:  Vitals:   03/05/23 0818  TempSrc:   PainSc: 0-No pain         Complications: There were no known notable events for this encounter.

## 2023-03-05 NOTE — Anesthesia Postprocedure Evaluation (Signed)
Anesthesia Post Note  Patient: Stacey Underwood  Procedure(s) Performed: COLONOSCOPY WITH PROPOFOL  Patient location during evaluation: PACU Anesthesia Type: General Level of consciousness: awake and awake and alert Pain management: pain level controlled Vital Signs Assessment: post-procedure vital signs reviewed and stable Respiratory status: spontaneous breathing Cardiovascular status: stable Anesthetic complications: no   There were no known notable events for this encounter.   Last Vitals:  Vitals:   03/05/23 0828 03/05/23 0838  BP: (!) 108/58 109/70  Pulse: 86 80  Resp: 16 16  Temp:    SpO2: 100% 100%    Last Pain:  Vitals:   03/05/23 0838  TempSrc:   PainSc: 0-No pain                 VAN STAVEREN,Britnay Magnussen

## 2023-03-10 ENCOUNTER — Encounter: Payer: Self-pay | Admitting: Gastroenterology

## 2023-03-24 DIAGNOSIS — K5900 Constipation, unspecified: Secondary | ICD-10-CM | POA: Diagnosis not present

## 2023-03-24 DIAGNOSIS — R1012 Left upper quadrant pain: Secondary | ICD-10-CM | POA: Diagnosis not present

## 2023-03-24 DIAGNOSIS — R110 Nausea: Secondary | ICD-10-CM | POA: Diagnosis not present

## 2023-03-24 DIAGNOSIS — R1011 Right upper quadrant pain: Secondary | ICD-10-CM | POA: Diagnosis not present

## 2023-03-31 ENCOUNTER — Other Ambulatory Visit: Payer: Self-pay | Admitting: Nurse Practitioner

## 2023-03-31 DIAGNOSIS — R1013 Epigastric pain: Secondary | ICD-10-CM

## 2023-03-31 DIAGNOSIS — R110 Nausea: Secondary | ICD-10-CM

## 2023-04-29 ENCOUNTER — Ambulatory Visit: Payer: 59 | Admitting: Nurse Practitioner

## 2023-04-29 VITALS — BP 106/80 | HR 67 | Temp 98.4°F | Ht 61.0 in | Wt 199.8 lb

## 2023-04-29 DIAGNOSIS — R112 Nausea with vomiting, unspecified: Secondary | ICD-10-CM

## 2023-04-29 DIAGNOSIS — R1010 Upper abdominal pain, unspecified: Secondary | ICD-10-CM

## 2023-04-29 LAB — COMPREHENSIVE METABOLIC PANEL
ALT: 12 U/L (ref 0–35)
AST: 11 U/L (ref 0–37)
Albumin: 4.2 g/dL (ref 3.5–5.2)
Alkaline Phosphatase: 66 U/L (ref 39–117)
BUN: 12 mg/dL (ref 6–23)
CO2: 27 meq/L (ref 19–32)
Calcium: 9.1 mg/dL (ref 8.4–10.5)
Chloride: 107 meq/L (ref 96–112)
Creatinine, Ser: 0.62 mg/dL (ref 0.40–1.20)
GFR: 106.01 mL/min (ref >60.00)
Glucose, Bld: 109 mg/dL — ABNORMAL HIGH (ref 70–99)
Potassium: 4 meq/L (ref 3.5–5.1)
Sodium: 141 meq/L (ref 135–145)
Total Bilirubin: 0.3 mg/dL (ref 0.2–1.2)
Total Protein: 6.8 g/dL (ref 6.0–8.3)

## 2023-04-29 LAB — LIPASE: Lipase: 46 U/L (ref 11.0–59.0)

## 2023-04-29 LAB — CBC
HCT: 37.8 % (ref 36.0–46.0)
Hemoglobin: 13 g/dL (ref 12.0–15.0)
MCHC: 34.3 g/dL (ref 30.0–36.0)
MCV: 85.1 fL (ref 78.0–100.0)
Platelets: 374 10*3/uL (ref 150.0–400.0)
RBC: 4.44 Mil/uL (ref 3.87–5.11)
RDW: 14.6 % (ref 11.5–15.5)
WBC: 6.3 10*3/uL (ref 4.0–10.5)

## 2023-04-29 MED ORDER — PANTOPRAZOLE SODIUM 40 MG PO TBEC: 40.0000 mg | DELAYED_RELEASE_TABLET | Freq: Every day | ORAL | 1 refills | Status: AC

## 2023-04-29 NOTE — Patient Instructions (Signed)
Nice to see you today I am changing the omeprazole to Protonix I will be I touch with the labs.  Continue medications as are for now

## 2023-04-29 NOTE — Assessment & Plan Note (Signed)
History of the same has been tested for H. pylori in the past that was negative.  Omeprazole.  Will switch patient to pantoprazole 40 mg daily for 1 month.  Basic lab including CBC CMP and lipase.  If labs normal consider backing off the GLP-1 see if this is the culprit Protonix does not help.  If either those are not beneficial patient will need a referral to GI

## 2023-04-29 NOTE — Assessment & Plan Note (Signed)
Pending basic labs.  Patient states she has ondansetron through the online company that screening her GLP-1 for weight loss.

## 2023-04-29 NOTE — Progress Notes (Signed)
Acute Office Visit  Subjective:     Patient ID: Stacey Underwood, female    DOB: 1975-06-15, 48 y.o.   MRN: [id number]  Chief Complaint  Patient presents with   Hernia    Pt complains of going to ER before christmas pt states of bad stomach pains. ER states she passed kidney stones and gas. Pt complains that over a week ago she started getting sternum pains, diarrhea, and projectile vomiting. Bloating. Pt states the hernia is sticking out and is tender.     HPI Patient is in today for abdominal pain with a history of anemia, prediabetes, headaches, HLD, obesity  Of note patient was seen in the emergency department on 03/24/2023 where she had a CT abdomen pelvis.  A temperature no definite acute process within the abdomen or pelvis.  The patient has had a CT abdomen pelvis done on 08/30/2022 that showed a small hiatal hernia at that juncture  States that it has been going on for a while. States leading up to last Sunday it was the worst. State that she had diarrhea that turned to water. States that she has the constant feeling of like she was going to vomit. States that she felt febrile. States that Sunday she got hot and started proectile vomiting. States that it lasted a min and was the one episode. The Sunday in question 04/20/2023  States that she has changed her diet and followed a GERD diet. She is having nausea and her bowels are back to her normal.  States that she is on wegovy 0.5mg  she has done 7 weeks of therapy.   Review of Systems  Constitutional:  Negative for chills and fever.  Respiratory:  Negative for shortness of breath.   Cardiovascular:  Negative for chest pain.  Gastrointestinal:  Positive for abdominal pain and nausea. Negative for constipation, diarrhea and vomiting.  Neurological:  Positive for dizziness. Negative for headaches.  Psychiatric/Behavioral:  Negative for hallucinations and suicidal ideas.         Objective:    BP 106/80   Pulse 67   Temp  98.4 F (36.9 C) (Oral)   Ht 5\' 1"  (1.549 m)   Wt 199 lb 12.8 oz (90.6 kg)   SpO2 98%   BMI 37.75 kg/m    Physical Exam Vitals and nursing note reviewed.  Constitutional:      Appearance: Normal appearance.  Cardiovascular:     Rate and Rhythm: Normal rate and regular rhythm.     Heart sounds: Normal heart sounds.  Pulmonary:     Effort: Pulmonary effort is normal.     Breath sounds: Normal breath sounds.  Abdominal:     General: Bowel sounds are normal. There is no distension.     Palpations: There is no mass.     Tenderness: There is abdominal tenderness. There is no right CVA tenderness or left CVA tenderness.     Hernia: No hernia is present.    Neurological:     Mental Status: She is alert.     No results found for any visits on 04/29/23.      Assessment & Plan:   Problem List Items Addressed This Visit       Digestive   Nausea and vomiting   Pending basic labs.  Patient states she has ondansetron through the online company that screening her GLP-1 for weight loss.      Relevant Orders   CBC   Comprehensive metabolic panel   Lipase  Other   Upper abdominal pain - Primary   History of the same has been tested for H. pylori in the past that was negative.  Omeprazole.  Will switch patient to pantoprazole 40 mg daily for 1 month.  Basic lab including CBC CMP and lipase.  If labs normal consider backing off the GLP-1 see if this is the culprit Protonix does not help.  If either those are not beneficial patient will need a referral to GI      Relevant Medications   pantoprazole (PROTONIX) 40 MG tablet   Other Relevant Orders   CBC   Comprehensive metabolic panel   Lipase    Meds ordered this encounter  Medications   pantoprazole (PROTONIX) 40 MG tablet    Sig: Take 1 tablet (40 mg total) by mouth daily.    Dispense:  30 tablet    Refill:  1    Supervising Provider:   Roxy Manns A [[id number]]    Return if symptoms worsen or fail to  improve.  Audria Nine, NP

## 2023-04-30 ENCOUNTER — Encounter: Payer: Self-pay | Admitting: Nurse Practitioner

## 2023-04-30 DIAGNOSIS — A09 Infectious gastroenteritis and colitis, unspecified: Secondary | ICD-10-CM

## 2023-05-07 NOTE — Addendum Note (Signed)
Addended by: Eden Emms on: 05/07/2023 02:12 PM   Modules accepted: Orders

## 2023-05-09 ENCOUNTER — Telehealth: Payer: Self-pay | Admitting: Nurse Practitioner

## 2023-05-09 DIAGNOSIS — R1010 Upper abdominal pain, unspecified: Secondary | ICD-10-CM

## 2023-05-09 NOTE — Telephone Encounter (Signed)
-----   Message from [facility] sent at 04/30/2023  8:02 AM EST ----- Regarding: Protonix See if switching from omeprazole to protonix helped the stomach pain. If not Refer to GI

## 2023-05-09 NOTE — Telephone Encounter (Signed)
Can we call and see how the patient is doing. ALso can we see if switching to protonix helped with her stomach pain

## 2023-05-09 NOTE — Telephone Encounter (Signed)
Left message on VM per DPR to see how she is doing with the medication change.

## 2023-05-13 NOTE — Telephone Encounter (Signed)
 Left voicemail for patient to return call to office.

## 2023-05-14 NOTE — Addendum Note (Signed)
Addended by: Eden Emms on: 05/14/2023 02:18 PM   Modules accepted: Orders

## 2023-05-14 NOTE — Telephone Encounter (Signed)
 Spoke with patient to see if switching helped with the stomach pain and she states that it did not. She is agreeable to referral to GI

## 2023-07-19 ENCOUNTER — Other Ambulatory Visit: Payer: Self-pay | Admitting: Nurse Practitioner

## 2023-09-22 ENCOUNTER — Ambulatory Visit (INDEPENDENT_AMBULATORY_CARE_PROVIDER_SITE_OTHER)
Admission: RE | Admit: 2023-09-22 | Discharge: 2023-09-22 | Disposition: A | Discharge status: Home / Self Care | Source: Ambulatory Visit | Attending: Nurse Practitioner

## 2023-09-22 ENCOUNTER — Ambulatory Visit (INDEPENDENT_AMBULATORY_CARE_PROVIDER_SITE_OTHER): Admitting: Nurse Practitioner

## 2023-09-22 VITALS — BP 116/82 | HR 62 | Temp 98.0°F | Ht 61.0 in | Wt 191.8 lb

## 2023-09-22 DIAGNOSIS — M19072 Primary osteoarthritis, left ankle and foot: Secondary | ICD-10-CM | POA: Diagnosis not present

## 2023-09-22 DIAGNOSIS — M79672 Pain in left foot: Secondary | ICD-10-CM

## 2023-09-22 DIAGNOSIS — M7732 Calcaneal spur, left foot: Secondary | ICD-10-CM | POA: Diagnosis not present

## 2023-09-22 MED ORDER — MELOXICAM 15 MG PO TABS: 15.0000 mg | ORAL_TABLET | Freq: Every day | ORAL | 0 refills | Status: DC

## 2023-09-22 NOTE — Progress Notes (Signed)
 Acute Office Visit  Subjective:     Patient ID: Stacey Underwood, female    DOB: [birth year], 48 y.o.   MRN: [id number]  Chief Complaint  Patient presents with   Foot Pain    Pt complains of ongoing foot pain that has increased and radiated to the R foot pain. Pt states she is starting to have L knee pain. Pt has been wearing brace for L foot. States it helps a little. Pt complains of losing balance.      Patient is in today for joint pain with a history of left heel pain, epigastric pain,   States that she is still having left foot pain and having to baby the foot. She is walking on the ball and toes and those are starting to hurt. She is also now having left knee pain . She got a pari of ultraboost shoes and a compression sleeve. States that she started to hurt on the right foot. Stats that it was from the heel to the toes. Since she stopped wearing the shoes he rright foot has stopped hurt.  State that her left foot hurts all the time but worse with first getting on it. It will start to getting better but by the end of the day it is worse. Statse that she states it is worse when she stand on it and still  States that the pain is sharp stabbing with weight baring and achy pain with rest.   Review of Systems  Constitutional:  Negative for chills and fever.  Respiratory:  Negative for shortness of breath.   Cardiovascular:  Negative for chest pain.  Musculoskeletal:  Positive for joint pain.        Objective:    BP 116/82   Pulse 62   Temp 98 F (36.7 C) (Oral)   Ht 5' 1 (1.549 m)   Wt 191 lb 12.8 oz (87 kg)   SpO2 99%   BMI 36.24 kg/m  BP Readings from Last 3 Encounters:  09/22/23 116/82  04/29/23 106/80  03/05/23 109/70   Wt Readings from Last 3 Encounters:  09/22/23 191 lb 12.8 oz (87 kg)  04/29/23 199 lb 12.8 oz (90.6 kg)  03/05/23 207 lb (93.9 kg)   SpO2 Readings from Last 3 Encounters:  09/22/23 99%  04/29/23 98%  03/05/23 100%      Physical  Exam Vitals and nursing note reviewed.  Constitutional:      Appearance: Normal appearance.   Cardiovascular:     Rate and Rhythm: Normal rate and regular rhythm.     Heart sounds: Normal heart sounds.  Pulmonary:     Effort: Pulmonary effort is normal.     Breath sounds: Normal breath sounds.   Musculoskeletal:       Feet:     Comments: Mild discomfort with dorsal flexion. No tenderness on Achilles tendon  Feet:     Comments: 2+ DP and PT pulses bilaterally   Neurological:     Mental Status: She is alert.     No results found for any visits on 09/22/23.      Assessment & Plan:   Problem List Items Addressed This Visit       Other   Pain of left heel - Primary   Presentation of plantar fasciitis.  Patient is disabled with mild remittent.  Ankle motion to see sports medicine and she would likely benefit from an injection to anti-inflammatories daily with food for 7 to 10 days and  then as needed thereafter.  Will obtain plain film of foot since it has been going for a long.  Plantar fasciitis rehab exercises given at discharge follow encouraged supportive wear      Relevant Medications   meloxicam  (MOBIC ) 15 MG tablet   Other Relevant Orders   DG Foot 2 Views Left    Meds ordered this encounter  Medications   meloxicam  (MOBIC ) 15 MG tablet    Sig: Take 1 tablet (15 mg total) by mouth daily.    Dispense:  30 tablet    Refill:  0    Supervising Provider:   Deri Fleet A [[id number]]    Return in about 1 day (around 09/23/2023).  [name], NP

## 2023-09-22 NOTE — Patient Instructions (Signed)
 Nice to see you today Make an appointment with Dr. Geralyn Knee for your foot   Make an appointment with me in the next few weeks for yoour physical and labs

## 2023-09-22 NOTE — Assessment & Plan Note (Signed)
 Presentation of plantar fasciitis.  Patient is disabled with mild remittent.  Ankle motion to see sports medicine and she would likely benefit from an injection to anti-inflammatories daily with food for 7 to 10 days and then as needed thereafter.  Will obtain plain film of foot since it has been going for a long.  Plantar fasciitis rehab exercises given at discharge follow encouraged supportive wear

## 2023-09-24 ENCOUNTER — Ambulatory Visit: Payer: Self-pay | Admitting: Nurse Practitioner

## 2023-09-25 ENCOUNTER — Ambulatory Visit: Admitting: Nurse Practitioner

## 2023-09-26 ENCOUNTER — Ambulatory Visit (INDEPENDENT_AMBULATORY_CARE_PROVIDER_SITE_OTHER): Payer: 59 | Admitting: Nurse Practitioner

## 2023-09-26 ENCOUNTER — Encounter: Admitting: Nurse Practitioner

## 2023-09-26 ENCOUNTER — Encounter: Payer: Self-pay | Admitting: Nurse Practitioner

## 2023-09-26 VITALS — BP 108/72 | HR 73 | Temp 98.0°F | Ht 61.95 in | Wt 193.0 lb

## 2023-09-26 DIAGNOSIS — Z13220 Encounter for screening for lipoid disorders: Secondary | ICD-10-CM

## 2023-09-26 DIAGNOSIS — Z0000 Encounter for general adult medical examination without abnormal findings: Secondary | ICD-10-CM | POA: Diagnosis not present

## 2023-09-26 DIAGNOSIS — K219 Gastro-esophageal reflux disease without esophagitis: Secondary | ICD-10-CM | POA: Diagnosis not present

## 2023-09-26 DIAGNOSIS — Z131 Encounter for screening for diabetes mellitus: Secondary | ICD-10-CM | POA: Diagnosis not present

## 2023-09-26 LAB — LIPID PANEL
Cholesterol: 185 mg/dL (ref 0–200)
HDL: 50.8 mg/dL (ref >39.00)
LDL Cholesterol: 116 mg/dL — ABNORMAL HIGH (ref 0–99)
NonHDL: 134.06
Total CHOL/HDL Ratio: 4
Triglycerides: 90 mg/dL (ref 0.0–149.0)
VLDL: 18 mg/dL (ref 0.0–40.0)

## 2023-09-26 LAB — CBC
HCT: 34.1 % — ABNORMAL LOW (ref 36.0–46.0)
Hemoglobin: 11.5 g/dL — ABNORMAL LOW (ref 12.0–15.0)
MCHC: 33.8 g/dL (ref 30.0–36.0)
MCV: 83.5 fl (ref 78.0–100.0)
Platelets: 360 10*3/uL (ref 150.0–400.0)
RBC: 4.08 Mil/uL (ref 3.87–5.11)
RDW: 13.7 % (ref 11.5–15.5)
WBC: 5.7 10*3/uL (ref 4.0–10.5)

## 2023-09-26 LAB — COMPREHENSIVE METABOLIC PANEL WITH GFR
ALT: 11 U/L (ref 0–35)
AST: 11 U/L (ref 0–37)
Albumin: 4.1 g/dL (ref 3.5–5.2)
Alkaline Phosphatase: 63 U/L (ref 39–117)
BUN: 20 mg/dL (ref 6–23)
CO2: 27 meq/L (ref 19–32)
Calcium: 9.7 mg/dL (ref 8.4–10.5)
Chloride: 108 meq/L (ref 96–112)
Creatinine, Ser: 0.67 mg/dL (ref 0.40–1.20)
GFR: 103.75 mL/min (ref >60.00)
Glucose, Bld: 102 mg/dL — ABNORMAL HIGH (ref 70–99)
Potassium: 4.8 meq/L (ref 3.5–5.1)
Sodium: 140 meq/L (ref 135–145)
Total Bilirubin: 0.3 mg/dL (ref 0.2–1.2)
Total Protein: 6.7 g/dL (ref 6.0–8.3)

## 2023-09-26 LAB — TSH: TSH: 0.88 u[IU]/mL (ref 0.35–5.50)

## 2023-09-26 LAB — HEMOGLOBIN A1C: Hgb A1c MFr Bld: 5.9 % (ref 4.6–6.5)

## 2023-09-26 NOTE — Assessment & Plan Note (Signed)
 Currently maintained on protonix  40mg  prn. She has changed her diet and that has helped

## 2023-09-26 NOTE — Assessment & Plan Note (Signed)
 Discussed age appropriate immunizations and screengng exams. Did review patients personnel, social. Surgical, and family histories.  Patient is up-to-date on all age-appropriate vaccinations she would like.  Patient is up-to-date on CRC screening.  And breast cancer screening.  Patient will follow-up with GYN about cervical cancer screening and to have her repeat mammogram in the fall.  Patient was given information at discharge about preventative healthcare maintenance with anticipatory guidance.

## 2023-09-26 NOTE — Patient Instructions (Signed)
 Nice to see you today I will be in touch with the labs once I have them  Follow up with me in 1 year

## 2023-09-26 NOTE — Progress Notes (Signed)
 Established Patient Office Visit  Subjective   Patient ID: MASSIAH LONGANECKER, female    DOB: [birth year]  Age: 48 y.o. MRN: [id number]  Chief Complaint  Patient presents with   Annual Exam    HPI  Allergies: Patient currently maintained on fexofenadine  and fluticasone  nasal spray as needed  GERD: Patient currently maintained on Protonix  40 mg prn   Weight loss: Semaglutide? States that she is switching to ozempic  for complete physical and follow up of chronic conditions.  Immunizations: -Tetanus: Completed in 2023 -Influenza: Out of season  -Shingles: Too young -Pneumonia: Too young  Diet: Fair diet. She is eating 2 meals a day at least. States that she has changed about how she eats. States that she does no caffeine or caffeine. States that she has limited fried foods. Limits acidic food.  Exercise: No regular exercise. Walks often and has been swimming in the pool   Eye exam: Completes annually. She has reading glasses  Dental exam: Completes semi-annually    Colonoscopy: Completed in 03/05/2023, repeat in 10 years Lung Cancer Screening: NA   Pap smear: 2016?  GYN. States that she sees her in October yearly  Mammogram: Diagnostic mammogram 01/30/2023 with recall screening mammogram in 1 year  DEXA: too young   Sleep: goes to bed around 10 and on normal basis 730-4am. Feels rested and does snore.        Review of Systems  Constitutional:  Negative for chills and fever.  Respiratory:  Negative for shortness of breath.   Cardiovascular:  Negative for chest pain and leg swelling.  Gastrointestinal:  Negative for abdominal pain, blood in stool, constipation, diarrhea, nausea and vomiting.       BM daily   Genitourinary:  Negative for dysuria and hematuria.  Neurological:  Positive for headaches. Negative for dizziness and tingling.  Psychiatric/Behavioral:  Negative for hallucinations and suicidal ideas.       Objective:     BP 108/72   Pulse 73   Temp 98  F (36.7 C) (Oral)   Ht 5' 1.95 (1.574 m)   Wt 193 lb (87.5 kg)   SpO2 98%   BMI 35.36 kg/m  BP Readings from Last 3 Encounters:  09/26/23 108/72  09/22/23 116/82  04/29/23 106/80   Wt Readings from Last 3 Encounters:  09/26/23 193 lb (87.5 kg)  09/22/23 191 lb 12.8 oz (87 kg)  04/29/23 199 lb 12.8 oz (90.6 kg)   SpO2 Readings from Last 3 Encounters:  09/26/23 98%  09/22/23 99%  04/29/23 98%      Physical Exam Vitals and nursing note reviewed.  Constitutional:      Appearance: Normal appearance.  HENT:     Right Ear: Tympanic membrane, ear canal and external ear normal.     Left Ear: Tympanic membrane, ear canal and external ear normal.     Mouth/Throat:     Mouth: Mucous membranes are moist.     Pharynx: Oropharynx is clear.   Eyes:     Extraocular Movements: Extraocular movements intact.     Pupils: Pupils are equal, round, and reactive to light.    Cardiovascular:     Rate and Rhythm: Normal rate and regular rhythm.     Pulses: Normal pulses.     Heart sounds: Normal heart sounds.  Pulmonary:     Effort: Pulmonary effort is normal.     Breath sounds: Normal breath sounds.  Abdominal:     General: Bowel sounds are normal. There  is no distension.     Palpations: There is no mass.     Tenderness: There is no abdominal tenderness.     Hernia: No hernia is present.   Musculoskeletal:     Right lower leg: No edema.     Left lower leg: No edema.  Lymphadenopathy:     Cervical: No cervical adenopathy.   Skin:    General: Skin is warm.   Neurological:     General: No focal deficit present.     Mental Status: She is alert.     Deep Tendon Reflexes:     Reflex Scores:      Bicep reflexes are 2+ on the right side and 2+ on the left side.      Patellar reflexes are 2+ on the right side and 2+ on the left side.    Comments: Bilateral upper and lower extremity strength 5/5  Psychiatric:        Mood and Affect: Mood normal.        Behavior: Behavior normal.         Thought Content: Thought content normal.        Judgment: Judgment normal.      No results found for any visits on 09/26/23.    The 10-year ASCVD risk score (Arnett DK, et al., 2019) is: 0.9%    Assessment & Plan:   Problem List Items Addressed This Visit       Digestive   Gastroesophageal reflux disease   Currently maintained on protonix  40mg  prn. She has changed her diet and that has helped        Other   Preventative health care - Primary   Discussed age appropriate immunizations and screengng exams. Did review patients personnel, social. Surgical, and family histories.  Patient is up-to-date on all age-appropriate vaccinations she would like.  Patient is up-to-date on CRC screening.  And breast cancer screening.  Patient will follow-up with GYN about cervical cancer screening and to have her repeat mammogram in the fall.  Patient was given information at discharge about preventative healthcare maintenance with anticipatory guidance.      Relevant Orders   CBC   Comprehensive metabolic panel with GFR   TSH   Other Visit Diagnoses       Screening for lipid disorders       Relevant Orders   Lipid panel     Screening for diabetes mellitus       Relevant Orders   Hemoglobin A1c       Return in about 1 year (around 09/25/2024) for CPE and Labs.    [name], NP

## 2023-09-28 NOTE — Progress Notes (Unsigned)
     Stacey Passon T. Chanler Mendonca, MD, CAQ Sports Medicine [facility] [address] [place], [address]  Phone: [phone]  FAX: [phone]  Stacey Underwood - 48 y.o. female  MRN [id number]  Date of Birth: [birth year]  Date: 09/29/2023  PCP: Stacey Lynwood HERO, NP  Referral: Stacey Lynwood HERO, NP  No chief complaint on file.  Subjective:   Stacey Underwood is a 48 y.o. very pleasant female patient with There is no height or weight on file to calculate BMI. who presents with the following:  The patient presents with left-sided foot pain.    Review of Systems is noted in the HPI, as appropriate  Objective:   There were no vitals taken for this visit.  GEN: No acute distress; alert,appropriate. PULM: Breathing comfortably in no respiratory distress PSYCH: Normally interactive.   Laboratory and Imaging Data:  Assessment and Plan:   ***

## 2023-09-29 ENCOUNTER — Ambulatory Visit (INDEPENDENT_AMBULATORY_CARE_PROVIDER_SITE_OTHER): Admitting: Family Medicine

## 2023-09-29 ENCOUNTER — Encounter: Payer: Self-pay | Admitting: Family Medicine

## 2023-09-29 VITALS — BP 100/68 | HR 79 | Temp 98.4°F | Ht 61.95 in | Wt 194.4 lb

## 2023-09-29 DIAGNOSIS — M722 Plantar fascial fibromatosis: Secondary | ICD-10-CM

## 2023-09-29 DIAGNOSIS — M79672 Pain in left foot: Secondary | ICD-10-CM

## 2023-09-29 MED ORDER — TRIAMCINOLONE ACETONIDE 40 MG/ML IJ SUSP
40.0000 mg | Freq: Once | INTRAMUSCULAR | Status: AC
  Administered 2023-09-29: 40 mg via INTRA_ARTICULAR

## 2023-09-29 NOTE — Patient Instructions (Signed)
Please read handouts on Plantar Fascitis.  STRETCHING and Strengthening program critically important.  Strengthening on foot and calf muscles as seen in handout. Calf raises, 2 legged, then 1 legged. Foot massage with tennis ball. Ice massage.  Towel Scrunches: get a towel or hand towel, use toes to pick up and scrunch up the towel.  Marble pick-ups, practice picking up marbles with toes and placing into a cup  NEEDS TO BE DONE EVERY DAY  Recommended over the counter insoles. (Spenco or Hapad)  A rigid shoe with good arch support helps: Dansko (great), Keen, Merrell No easily bendable shoes.   Tuli's heel cups  

## 2023-09-30 ENCOUNTER — Ambulatory Visit: Payer: Self-pay | Admitting: Nurse Practitioner

## 2023-10-19 ENCOUNTER — Other Ambulatory Visit: Payer: Self-pay | Admitting: Nurse Practitioner

## 2023-10-19 DIAGNOSIS — M79672 Pain in left foot: Secondary | ICD-10-CM

## 2023-11-27 ENCOUNTER — Other Ambulatory Visit: Payer: Self-pay | Admitting: Nurse Practitioner

## 2023-11-27 DIAGNOSIS — M79672 Pain in left foot: Secondary | ICD-10-CM

## 2024-01-01 ENCOUNTER — Other Ambulatory Visit: Payer: Self-pay | Admitting: Family

## 2024-01-01 DIAGNOSIS — M79672 Pain in left foot: Secondary | ICD-10-CM
